# Patient Record
Sex: Male | Born: 1950
Health system: Southern US, Community
[De-identification: ages and names within clinical notes are randomized; demographics above are authoritative.]

## PROBLEM LIST (undated history)

## (undated) DIAGNOSIS — M1990 Unspecified osteoarthritis, unspecified site: Secondary | ICD-10-CM

## (undated) DIAGNOSIS — G4730 Sleep apnea, unspecified: Secondary | ICD-10-CM

## (undated) DIAGNOSIS — T7840XA Allergy, unspecified, initial encounter: Secondary | ICD-10-CM

## (undated) DIAGNOSIS — J45909 Unspecified asthma, uncomplicated: Secondary | ICD-10-CM

## (undated) HISTORY — PX: POLYPECTOMY: SHX149

## (undated) HISTORY — PX: TONSILLECTOMY AND ADENOIDECTOMY: SUR1326

## (undated) HISTORY — DX: Allergy, unspecified, initial encounter: T78.40XA

## (undated) HISTORY — DX: Unspecified osteoarthritis, unspecified site: M19.90

## (undated) HISTORY — PX: ELBOW SURGERY: SHX618

---
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
## 2013-03-25 HISTORY — PX: COLONOSCOPY: SHX174

## 2013-10-30 DIAGNOSIS — J45909 Unspecified asthma, uncomplicated: Secondary | ICD-10-CM

## 2013-10-30 HISTORY — DX: Unspecified asthma, uncomplicated: J45.909

## 2016-11-10 DIAGNOSIS — E782 Mixed hyperlipidemia: Secondary | ICD-10-CM | POA: Diagnosis not present

## 2016-11-10 DIAGNOSIS — R7301 Impaired fasting glucose: Secondary | ICD-10-CM | POA: Diagnosis not present

## 2016-11-10 DIAGNOSIS — Z131 Encounter for screening for diabetes mellitus: Secondary | ICD-10-CM | POA: Diagnosis not present

## 2016-11-10 DIAGNOSIS — Z125 Encounter for screening for malignant neoplasm of prostate: Secondary | ICD-10-CM | POA: Diagnosis not present

## 2016-11-10 DIAGNOSIS — Z0000 Encounter for general adult medical examination without abnormal findings: Secondary | ICD-10-CM | POA: Diagnosis not present

## 2016-11-10 DIAGNOSIS — Z1389 Encounter for screening for other disorder: Secondary | ICD-10-CM | POA: Diagnosis not present

## 2016-11-10 DIAGNOSIS — Z6833 Body mass index (BMI) 33.0-33.9, adult: Secondary | ICD-10-CM | POA: Diagnosis not present

## 2016-11-10 DIAGNOSIS — Z1211 Encounter for screening for malignant neoplasm of colon: Secondary | ICD-10-CM | POA: Diagnosis not present

## 2016-11-10 DIAGNOSIS — Z13220 Encounter for screening for lipoid disorders: Secondary | ICD-10-CM | POA: Diagnosis not present

## 2016-11-15 DIAGNOSIS — Z0184 Encounter for antibody response examination: Secondary | ICD-10-CM | POA: Diagnosis not present

## 2016-11-22 DIAGNOSIS — M7989 Other specified soft tissue disorders: Secondary | ICD-10-CM | POA: Diagnosis not present

## 2016-11-22 DIAGNOSIS — Z6832 Body mass index (BMI) 32.0-32.9, adult: Secondary | ICD-10-CM | POA: Diagnosis not present

## 2016-11-22 DIAGNOSIS — Z8619 Personal history of other infectious and parasitic diseases: Secondary | ICD-10-CM | POA: Diagnosis not present

## 2016-11-22 DIAGNOSIS — B86 Scabies: Secondary | ICD-10-CM | POA: Diagnosis not present

## 2016-11-22 DIAGNOSIS — M25562 Pain in left knee: Secondary | ICD-10-CM | POA: Diagnosis not present

## 2016-12-06 DIAGNOSIS — Z6833 Body mass index (BMI) 33.0-33.9, adult: Secondary | ICD-10-CM | POA: Diagnosis not present

## 2016-12-06 DIAGNOSIS — S8992XD Unspecified injury of left lower leg, subsequent encounter: Secondary | ICD-10-CM | POA: Diagnosis not present

## 2017-04-05 DIAGNOSIS — D1720 Benign lipomatous neoplasm of skin and subcutaneous tissue of unspecified limb: Secondary | ICD-10-CM | POA: Diagnosis not present

## 2017-04-05 DIAGNOSIS — Z6832 Body mass index (BMI) 32.0-32.9, adult: Secondary | ICD-10-CM | POA: Diagnosis not present

## 2017-04-05 DIAGNOSIS — Z23 Encounter for immunization: Secondary | ICD-10-CM | POA: Diagnosis not present

## 2017-04-05 DIAGNOSIS — Z7189 Other specified counseling: Secondary | ICD-10-CM | POA: Diagnosis not present

## 2017-06-20 DIAGNOSIS — J329 Chronic sinusitis, unspecified: Secondary | ICD-10-CM | POA: Diagnosis not present

## 2017-06-20 DIAGNOSIS — Z6832 Body mass index (BMI) 32.0-32.9, adult: Secondary | ICD-10-CM | POA: Diagnosis not present

## 2017-08-21 ENCOUNTER — Ambulatory Visit (INDEPENDENT_AMBULATORY_CARE_PROVIDER_SITE_OTHER): Payer: Medicare PPO

## 2017-08-21 ENCOUNTER — Encounter: Payer: Self-pay | Admitting: Podiatry

## 2017-08-21 ENCOUNTER — Ambulatory Visit: Payer: Medicare PPO | Admitting: Podiatry

## 2017-08-21 VITALS — BP 118/72 | HR 68 | Ht 68.0 in | Wt 214.0 lb

## 2017-08-21 DIAGNOSIS — M2012 Hallux valgus (acquired), left foot: Secondary | ICD-10-CM

## 2017-08-21 DIAGNOSIS — M79672 Pain in left foot: Secondary | ICD-10-CM

## 2017-08-21 DIAGNOSIS — M7752 Other enthesopathy of left foot: Secondary | ICD-10-CM

## 2017-08-21 DIAGNOSIS — D3610 Benign neoplasm of peripheral nerves and autonomic nervous system, unspecified: Secondary | ICD-10-CM

## 2017-08-21 DIAGNOSIS — M779 Enthesopathy, unspecified: Secondary | ICD-10-CM

## 2017-08-21 MED ORDER — MELOXICAM 15 MG PO TABS: 15.0000 mg | ORAL_TABLET | Freq: Every day | ORAL | 0 refills | Status: DC

## 2017-08-21 NOTE — Progress Notes (Signed)
  Subjective:  Patient ID: Jesse Johns, male    DOB: 1951-04-20,  MRN: [id number]  Chief Complaint  Patient presents with  . Bunions    bunion left 1st moving in the wrong direction   . Neuroma    possilbe neuroma between 2nd & 3rd toes feels like a blister at times but nothing is there pain comes and goes     67 y.o. male presents with the above complaint.  Reports a bunion to the left great toe.  Denies pain from the bunion.  Also complains of the pain between the second and third toes while walking.  No pain at rest.  States that he feels that there is a blister there sometimes.  No past medical history on file.   Current Outpatient Medications:  .  meloxicam (MOBIC) 15 MG tablet, Take 1 tablet (15 mg total) by mouth daily., Disp: 30 tablet, Rfl: 0  No Known Allergies Review of Systems Objective:   Vitals:   08/21/17 0912  BP: 118/72  Pulse: 68   General AA&O x3. Normal mood and affect.  Vascular Dors alis pedis and posterior tibial pulses  present 2+ bilaterally  Capillary refill normal to all digits. Pedal hair growth normal.  Neurologic Epicritic sensation grossly present.  Dermatologic No open lesions. Interspaces clear of maceration. Nails well groomed and normal in appearance.  Orthopedic: MMT 5/5 in dorsiflexion, plantarflexion, inversion, and eversion. Normal joint ROM without pain or crepitus. Local warmth and pain to palpation of the left second MPJ.  Pain to palpation third MPJ left HAV deformity bilateral without pain to palpation   Assessment & Plan:  Patient was evaluated and treated and all questions answered.  L 2nd MPJ Capsulitis -XR taken and reviewed.  Degenerative changes of the second proximal phalanx base.  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 Injectate: 0.5 cc 1% lidocaine plain, 0.5 cc dexamethasone  phosphate. Disposition: Patient tolerated procedure well. Injection site dressed with a band-aid.  HAV L -Asymptomatic. Discussed that should he require surgical correction of the second MPJ he would likely need bunion correction to allow the second toe to lay straight -Tube foam bunion shield dispensed  Return in about 3 weeks (around 09/11/2017) for Capsulitis.

## 2017-09-11 ENCOUNTER — Ambulatory Visit (INDEPENDENT_AMBULATORY_CARE_PROVIDER_SITE_OTHER): Payer: Medicare PPO | Admitting: Podiatry

## 2017-09-11 DIAGNOSIS — M779 Enthesopathy, unspecified: Secondary | ICD-10-CM

## 2017-09-11 DIAGNOSIS — M659 Synovitis and tenosynovitis, unspecified: Secondary | ICD-10-CM

## 2017-09-11 NOTE — Progress Notes (Signed)
  Subjective:  Patient ID: Jesse Johns, male    DOB: [birth year],  MRN: [id number]  Chief Complaint  Patient presents with  . capsulitis    F/U Lt capsulitis and Hav. Pt stated," first two weeks it was doing fine, but sensitivity started coming back again; tenderness." Tx: meloxicam (helps)   67 y.o. male returns for the above complaint.  States that he was doing well for the first 2 weeks after the injection but tenderness came back shortly after that.  Objective:  There were no vitals filed for this visit. General AA&O x3. Normal mood and affect.  Vascular Pedal pulses palpable.  Neurologic Epicritic sensation grossly intact.  Dermatologic No open lesions. Skin normal texture and turgor.  Orthopedic:  States that the first injection he was doing fine pain to palpation about the left second MPJ   Assessment & Plan:  Patient was evaluated and treated and all questions answered.  Second MPJ capsulitis left -Repeat injection as below  -Should pain recur would consider possible orthotic therapy versus surgical decompression of the joint  No Follow-up on file.

## 2017-10-02 ENCOUNTER — Ambulatory Visit: Payer: Medicare PPO | Admitting: Podiatry

## 2017-10-09 ENCOUNTER — Ambulatory Visit: Payer: Medicare PPO | Admitting: Podiatry

## 2017-10-09 DIAGNOSIS — M6740 Ganglion, unspecified site: Secondary | ICD-10-CM | POA: Diagnosis not present

## 2017-10-09 DIAGNOSIS — M779 Enthesopathy, unspecified: Secondary | ICD-10-CM

## 2017-10-09 MED ORDER — MELOXICAM 15 MG PO TABS
15.0000 mg | ORAL_TABLET | Freq: Every day | ORAL | 0 refills | Status: DC
Start: 2017-10-09 — End: 2019-04-08

## 2017-10-09 NOTE — Progress Notes (Signed)
  Subjective:  Patient ID: Jesse Johns, male    DOB: 28-May-1951,  MRN: [id number]  Chief Complaint  Patient presents with  . capsulitis    F/U Lt capsulitis Pt. stated," it seems like it's doing a little bit better, but still having some discomfort; 5/10." Tx: Meloxicam (not sure if it helps or not)   67 y.o. male returns for the above complaint.  States that he was doing well for the first 2 weeks after the injection but tenderness came back shortly after that.  Objective:  There were no vitals filed for this visit. General AA&O x3. Normal mood and affect.  Vascular Pedal pulses palpable.  Neurologic Epicritic sensation grossly intact.  Dermatologic No open lesions. Skin normal texture and turgor.  Orthopedic: Pain to palpation about the left second MPJ Ganglion cyst dorsal 2nd MPJ with pain to palpation.   Assessment & Plan:  Patient was evaluated and treated and all questions answered.  Second MPJ capsulitis left -Should pain persist will consider surgical decompression of the joint -Reinjection as below.  Procedure: Joint Injection Location: Left 2nd MPJ joint Skin Prep: Alcohol. Injectate: 0.5 cc 1% lidocaine plain, 0.5 cc dexamethasone phosphate. Disposition: Patient tolerated procedure well. Injection site dressed with a band-aid.  Ganglion Cyst Dorsal 2nd MPJ -Would consider removal in the future should it become symptomatic.  Return in about 3 weeks (around 10/30/2017) for Capsulitis, Jun[place].

## 2017-10-31 ENCOUNTER — Ambulatory Visit: Payer: Medicare PPO | Admitting: Podiatry

## 2017-10-31 ENCOUNTER — Encounter: Payer: Self-pay | Admitting: Podiatry

## 2017-10-31 DIAGNOSIS — M779 Enthesopathy, unspecified: Secondary | ICD-10-CM

## 2017-10-31 NOTE — Progress Notes (Signed)
  Subjective:  Patient ID: Jesse Johns, male    DOB: Nov 20, 1950,  MRN: [id number]  No chief complaint on file.  67 y.o. male returns for the above complaint.  States he thinks the injection did help him.  Still having some pain but does not like what it was before.  Using meloxicam and states that it helps.  Objective:  There were no vitals filed for this visit. General AA&O x3. Normal mood and affect.  Vascular Pedal pulses palpable.  Neurologic Epicritic sensation grossly intact.  Dermatologic No open lesions. Skin normal texture and turgor.  Orthopedic: Pain on palpation L 2nd MPJ Rigid hammertoe right second toe Palpable ganglion cyst second third MPJ HAV deformity left   Assessment & Plan:  Patient was evaluated and treated and all questions answered.  Capsulitis, Ganglion Cyst 2nd MPJ -Re-injection as below -Again discussed surgical options should pain persist. Would potentially require Austin bunionectomy, 2nd HT correction, 2nd MPJ replacement vs weil osteotomy.  Procedure: Joint Injection Location: Left 2nd MPJ joint Skin Prep: Alcohol. Injectate: 0.5 cc 1% lidocaine plain, 0.5 cc dexamethasone phosphate. Disposition: Patient tolerated procedure well. Injection site dressed with a band-aid.   Return in about 6 weeks (around 12/12/2017) for Tendonitis, Capsulitis.

## 2017-12-12 ENCOUNTER — Ambulatory Visit: Payer: Medicare PPO | Admitting: Podiatry

## 2017-12-12 DIAGNOSIS — M779 Enthesopathy, unspecified: Secondary | ICD-10-CM

## 2017-12-12 DIAGNOSIS — M7751 Other enthesopathy of right foot: Secondary | ICD-10-CM

## 2017-12-31 NOTE — Progress Notes (Signed)
  Subjective:  Patient ID: Jesse Johns, male    DOB: 04/23/1951,  MRN: [id number]  Chief Complaint  Patient presents with  . capsulitis    F/U capsulitis PT. stated," it's improving some, but it's still there; 3/10 dull pain." Tx: meloxicam (PRN)    67 y.o. male returns for the above complaint.  States pain is improving some but is still there is a 3 out of 10 dull pain Objective:  There were no vitals filed for this visit. General AA&O x3. Normal mood and affect.  Vascular Pedal pulses palpable.  Neurologic Epicritic sensation grossly intact.  Dermatologic No open lesions. Skin normal texture and turgor.  Orthopedic: Pain on palpation L 2nd MPJ Rigid hammertoe right second toe Palpable ganglion cyst second third MPJ HAV deformity left   Assessment & Plan:  Patient was evaluated and treated and all questions answered.  Capsulitis, Ganglion Cyst 2nd MPJ -Re-injection as below -Again discussed surgical options should pain persist. Would potentially require Austin bunionectomy, 2nd HT correction, 2nd MPJ replacement vs weil osteotomy.  Procedure: Joint Injection Location: Left 2nd MPJ joint Skin Prep: Alcohol. Injectate: 0.5 cc 1% lidocaine plain, 0.5 cc dexamethasone phosphate. Disposition: Patient tolerated procedure well. Injection site dressed with a band-aid.  F/u in 6 weeks.  No follow-ups on file.

## 2018-01-23 ENCOUNTER — Ambulatory Visit: Payer: Medicare PPO | Admitting: Podiatry

## 2018-01-23 DIAGNOSIS — M779 Enthesopathy, unspecified: Secondary | ICD-10-CM | POA: Diagnosis not present

## 2018-01-23 DIAGNOSIS — M659 Synovitis and tenosynovitis, unspecified: Secondary | ICD-10-CM | POA: Diagnosis not present

## 2018-01-23 DIAGNOSIS — M6740 Ganglion, unspecified site: Secondary | ICD-10-CM | POA: Diagnosis not present

## 2018-01-23 NOTE — Progress Notes (Signed)
  Subjective:  Patient ID: Jesse Johns, male    DOB: [birth year],  MRN: [id number]  Chief Complaint  Patient presents with  . capsulitis    F/U capsulitis PT. stated," been pretty good lately, it's better 67% better." tx: meloxicam (PRN)  . Ganglion Cyst    F/U 2nd MPJ Pt. stated," not sure if it's bigger or not."   67 y.o. male returns for the above complaint.  Cyst is about the same. Foot pain is 67% better. Objective:  There were no vitals filed for this visit. General AA&O x3. Normal mood and affect.  Vascular Pedal pulses palpable.  Neurologic Epicritic sensation grossly intact.  Dermatologic No open lesions. Skin normal texture and turgor.  Orthopedic: Pain on palpation L 2nd MPJ Rigid hammertoe right second toe Palpable ganglion cyst second third MPJ HAV deformity left   Assessment & Plan:  Patient was evaluated and treated and all questions answered.  Capsulitis, Ganglion Cyst 2nd MPJ -Improved. No injection today. Should pain persist would discuss surgical intervention. Would potentially require Austin bunionectomy, 2nd HT correction, 2nd MPJ replacement vs weil osteotomy.  F/u PRN for possible surgery should pain persist.

## 2018-02-01 ENCOUNTER — Encounter: Payer: Self-pay | Admitting: Gastroenterology

## 2018-02-15 DIAGNOSIS — Z1211 Encounter for screening for malignant neoplasm of colon: Secondary | ICD-10-CM | POA: Diagnosis not present

## 2018-02-15 DIAGNOSIS — Z1331 Encounter for screening for depression: Secondary | ICD-10-CM | POA: Diagnosis not present

## 2018-02-15 DIAGNOSIS — Z139 Encounter for screening, unspecified: Secondary | ICD-10-CM | POA: Diagnosis not present

## 2018-02-15 DIAGNOSIS — Z125 Encounter for screening for malignant neoplasm of prostate: Secondary | ICD-10-CM | POA: Diagnosis not present

## 2018-02-15 DIAGNOSIS — E782 Mixed hyperlipidemia: Secondary | ICD-10-CM | POA: Diagnosis not present

## 2018-02-15 DIAGNOSIS — N529 Male erectile dysfunction, unspecified: Secondary | ICD-10-CM | POA: Diagnosis not present

## 2018-02-15 DIAGNOSIS — R7301 Impaired fasting glucose: Secondary | ICD-10-CM | POA: Diagnosis not present

## 2018-02-15 DIAGNOSIS — Z0000 Encounter for general adult medical examination without abnormal findings: Secondary | ICD-10-CM | POA: Diagnosis not present

## 2018-02-23 DIAGNOSIS — E782 Mixed hyperlipidemia: Secondary | ICD-10-CM | POA: Diagnosis not present

## 2018-02-23 DIAGNOSIS — Z1331 Encounter for screening for depression: Secondary | ICD-10-CM | POA: Diagnosis not present

## 2018-02-23 DIAGNOSIS — M25561 Pain in right knee: Secondary | ICD-10-CM | POA: Diagnosis not present

## 2018-02-23 DIAGNOSIS — R7303 Prediabetes: Secondary | ICD-10-CM | POA: Diagnosis not present

## 2018-02-23 DIAGNOSIS — Z23 Encounter for immunization: Secondary | ICD-10-CM | POA: Diagnosis not present

## 2018-02-23 DIAGNOSIS — N529 Male erectile dysfunction, unspecified: Secondary | ICD-10-CM | POA: Diagnosis not present

## 2018-03-15 DIAGNOSIS — L821 Other seborrheic keratosis: Secondary | ICD-10-CM | POA: Diagnosis not present

## 2018-03-15 DIAGNOSIS — L578 Other skin changes due to chronic exposure to nonionizing radiation: Secondary | ICD-10-CM | POA: Diagnosis not present

## 2018-03-15 DIAGNOSIS — L820 Inflamed seborrheic keratosis: Secondary | ICD-10-CM | POA: Diagnosis not present

## 2018-03-15 DIAGNOSIS — L570 Actinic keratosis: Secondary | ICD-10-CM | POA: Diagnosis not present

## 2018-03-23 DIAGNOSIS — Z139 Encounter for screening, unspecified: Secondary | ICD-10-CM | POA: Diagnosis not present

## 2018-03-23 DIAGNOSIS — M25561 Pain in right knee: Secondary | ICD-10-CM | POA: Diagnosis not present

## 2018-03-23 DIAGNOSIS — Z6834 Body mass index (BMI) 34.0-34.9, adult: Secondary | ICD-10-CM | POA: Diagnosis not present

## 2018-04-02 DIAGNOSIS — Z6834 Body mass index (BMI) 34.0-34.9, adult: Secondary | ICD-10-CM | POA: Diagnosis not present

## 2018-04-02 DIAGNOSIS — E669 Obesity, unspecified: Secondary | ICD-10-CM | POA: Diagnosis not present

## 2018-04-02 DIAGNOSIS — H00019 Hordeolum externum unspecified eye, unspecified eyelid: Secondary | ICD-10-CM | POA: Diagnosis not present

## 2018-04-02 DIAGNOSIS — Z139 Encounter for screening, unspecified: Secondary | ICD-10-CM | POA: Diagnosis not present

## 2018-04-06 DIAGNOSIS — H00019 Hordeolum externum unspecified eye, unspecified eyelid: Secondary | ICD-10-CM | POA: Diagnosis not present

## 2018-04-06 DIAGNOSIS — M25561 Pain in right knee: Secondary | ICD-10-CM | POA: Diagnosis not present

## 2018-05-03 DIAGNOSIS — M25561 Pain in right knee: Secondary | ICD-10-CM | POA: Diagnosis not present

## 2018-05-03 DIAGNOSIS — M2351 Chronic instability of knee, right knee: Secondary | ICD-10-CM | POA: Diagnosis not present

## 2018-05-03 DIAGNOSIS — G8929 Other chronic pain: Secondary | ICD-10-CM | POA: Diagnosis not present

## 2018-05-09 ENCOUNTER — Other Ambulatory Visit: Payer: Self-pay | Admitting: Orthopedic Surgery

## 2018-05-09 DIAGNOSIS — M25561 Pain in right knee: Secondary | ICD-10-CM

## 2018-05-11 ENCOUNTER — Ambulatory Visit
Admission: RE | Admit: 2018-05-11 | Discharge: 2018-05-11 | Disposition: A | Discharge status: Home / Self Care | Payer: Medicare HMO | Source: Ambulatory Visit | Attending: Orthopedic Surgery | Admitting: Orthopedic Surgery

## 2018-05-11 DIAGNOSIS — M25561 Pain in right knee: Secondary | ICD-10-CM

## 2018-05-11 DIAGNOSIS — M23321 Other meniscus derangements, posterior horn of medial meniscus, right knee: Secondary | ICD-10-CM | POA: Diagnosis not present

## 2018-06-13 DIAGNOSIS — M6751 Plica syndrome, right knee: Secondary | ICD-10-CM | POA: Diagnosis not present

## 2018-06-13 DIAGNOSIS — M659 Synovitis and tenosynovitis, unspecified: Secondary | ICD-10-CM | POA: Diagnosis not present

## 2018-06-13 DIAGNOSIS — M94261 Chondromalacia, right knee: Secondary | ICD-10-CM | POA: Diagnosis not present

## 2018-06-13 DIAGNOSIS — S83231A Complex tear of medial meniscus, current injury, right knee, initial encounter: Secondary | ICD-10-CM | POA: Diagnosis not present

## 2018-06-13 DIAGNOSIS — G8918 Other acute postprocedural pain: Secondary | ICD-10-CM | POA: Diagnosis not present

## 2018-06-13 HISTORY — PX: KNEE ARTHROSCOPY W/ MENISCAL REPAIR: SHX1877

## 2018-06-19 DIAGNOSIS — Z9889 Other specified postprocedural states: Secondary | ICD-10-CM | POA: Insufficient documentation

## 2018-06-19 DIAGNOSIS — S83231A Complex tear of medial meniscus, current injury, right knee, initial encounter: Secondary | ICD-10-CM | POA: Diagnosis not present

## 2018-06-19 DIAGNOSIS — M25661 Stiffness of right knee, not elsewhere classified: Secondary | ICD-10-CM | POA: Diagnosis not present

## 2018-11-05 DIAGNOSIS — L821 Other seborrheic keratosis: Secondary | ICD-10-CM | POA: Diagnosis not present

## 2018-11-05 DIAGNOSIS — L820 Inflamed seborrheic keratosis: Secondary | ICD-10-CM | POA: Diagnosis not present

## 2018-11-05 DIAGNOSIS — L570 Actinic keratosis: Secondary | ICD-10-CM | POA: Diagnosis not present

## 2018-11-05 DIAGNOSIS — L578 Other skin changes due to chronic exposure to nonionizing radiation: Secondary | ICD-10-CM | POA: Diagnosis not present

## 2019-01-15 DIAGNOSIS — Z6834 Body mass index (BMI) 34.0-34.9, adult: Secondary | ICD-10-CM | POA: Diagnosis not present

## 2019-01-15 DIAGNOSIS — Z0000 Encounter for general adult medical examination without abnormal findings: Secondary | ICD-10-CM | POA: Diagnosis not present

## 2019-01-15 DIAGNOSIS — Z1331 Encounter for screening for depression: Secondary | ICD-10-CM | POA: Diagnosis not present

## 2019-01-15 DIAGNOSIS — Z139 Encounter for screening, unspecified: Secondary | ICD-10-CM | POA: Diagnosis not present

## 2019-01-15 DIAGNOSIS — R21 Rash and other nonspecific skin eruption: Secondary | ICD-10-CM | POA: Diagnosis not present

## 2019-03-07 ENCOUNTER — Encounter: Payer: Self-pay | Admitting: Gastroenterology

## 2019-03-18 DIAGNOSIS — E669 Obesity, unspecified: Secondary | ICD-10-CM | POA: Diagnosis not present

## 2019-03-18 DIAGNOSIS — L570 Actinic keratosis: Secondary | ICD-10-CM | POA: Diagnosis not present

## 2019-03-18 DIAGNOSIS — Z6833 Body mass index (BMI) 33.0-33.9, adult: Secondary | ICD-10-CM | POA: Diagnosis not present

## 2019-04-08 ENCOUNTER — Other Ambulatory Visit: Payer: Self-pay

## 2019-04-08 ENCOUNTER — Ambulatory Visit (AMBULATORY_SURGERY_CENTER): Payer: Self-pay | Admitting: *Deleted

## 2019-04-08 VITALS — Temp 96.9°F | Ht 68.0 in | Wt 224.6 lb

## 2019-04-08 DIAGNOSIS — Z86010 Personal history of colonic polyps: Secondary | ICD-10-CM

## 2019-04-08 DIAGNOSIS — K589 Irritable bowel syndrome without diarrhea: Secondary | ICD-10-CM | POA: Insufficient documentation

## 2019-04-08 DIAGNOSIS — J301 Allergic rhinitis due to pollen: Secondary | ICD-10-CM | POA: Insufficient documentation

## 2019-04-08 NOTE — Progress Notes (Signed)
Patient is here in-person for PV. Patient denies any allergies to eggs or soy. Patient denies any problems with anesthesia/sedation. Patient denies any oxygen use at home. Patient denies taking any diet/weight loss medications or blood thinners. Patient is not being treated for MRSA or C-diff. EMMI education assisgned to patient on colonoscopy, this was explained and instructions given to patient.   Pt is aware that care partner will wait in the car during procedure; if they feel like they will be too hot to wait in the car; they may wait in the lobby. Patient is aware to bring only one care partner. We want them to wear a mask (we do not have any that we can provide them), practice social distancing, and we will check their temperatures when they get here.  I did remind patient that their care partner needs to stay in the parking lot the entire time. Pt will wear mask into building.

## 2019-04-10 ENCOUNTER — Encounter: Payer: Self-pay | Admitting: Gastroenterology

## 2019-04-19 ENCOUNTER — Telehealth: Payer: Self-pay | Admitting: Gastroenterology

## 2019-04-22 ENCOUNTER — Ambulatory Visit (AMBULATORY_SURGERY_CENTER): Payer: Medicare PPO | Admitting: Gastroenterology

## 2019-04-22 ENCOUNTER — Encounter: Payer: Self-pay | Admitting: Gastroenterology

## 2019-04-22 ENCOUNTER — Other Ambulatory Visit: Payer: Self-pay

## 2019-04-22 VITALS — BP 135/70 | HR 60 | Temp 98.0°F | Resp 16 | Ht 68.0 in | Wt 224.0 lb

## 2019-04-22 DIAGNOSIS — Z86010 Personal history of colonic polyps: Secondary | ICD-10-CM

## 2019-04-22 MED ORDER — SODIUM CHLORIDE 0.9 % IV SOLN: 500.0000 mL | Freq: Once | INTRAVENOUS | Status: DC

## 2019-04-22 NOTE — Op Note (Signed)
[facility] Patient Name: Jesse Johns Procedure Date: 04/22/2019 9:03 AM MRN: [id number] Endoscopist: Jackquline Denmark , MD Age: 68 Referring MD:  Date of Birth: [birth year] Gender: Male Account #: [id number] Procedure:                Colonoscopy Indications:              High risk colon cancer surveillance: Personal                            history of colonic polyps Medicines:                Monitored Anesthesia Care Procedure:                Pre-Anesthesia Assessment:                           - Prior to the procedure, a History and Physical                            was performed, and patient medications and                            allergies were reviewed. The patient's tolerance of                            previous anesthesia was also reviewed. The risks                            and benefits of the procedure and the sedation                            options and risks were discussed with the patient.                            All questions were answered, and informed consent                            was obtained. Prior Anticoagulants: The patient has                            taken no previous anticoagulant or antiplatelet                            agents. ASA Grade Assessment: II - A patient with                            mild systemic disease. After reviewing the risks                            and benefits, the patient was deemed in                            satisfactory condition to undergo the procedure.  After obtaining informed consent, the colonoscope                            was passed under direct vision. Throughout the                            procedure, the patient's blood pressure, pulse, and                            oxygen saturations were monitored continuously. The                            Colonoscope was introduced through the anus and                            advanced to the the cecum, identified by                             appendiceal orifice and ileocecal valve. The                            colonoscopy was performed without difficulty. The                            patient tolerated the procedure well. The quality                            of the bowel preparation was good. The ileocecal                            valve, appendiceal orifice, and rectum were                            photographed. Scope In: 9:11:22 AM Scope Out: 9:18:50 AM Scope Withdrawal Time: 0 hours 5 minutes 32 seconds  Total Procedure Duration: 0 hours 7 minutes 28 seconds  Findings:                 Non-bleeding internal hemorrhoids were found during                            retroflexion. The hemorrhoids were small. Evidence                            of previous hemorrhoidectomy.                           The exam was otherwise without abnormality on                            direct and retroflexion views. Complications:            No immediate complications. Estimated Blood Loss:     Estimated blood loss: none. Impression:               - Non-bleeding internal hemorrhoids.                           -  The examination was otherwise normal on direct                            and retroflexion views.                           - No specimens collected. Recommendation:           - Patient has a contact number available for                            emergencies. The signs and symptoms of potential                            delayed complications were discussed with the                            patient. Return to normal activities tomorrow.                            Written discharge instructions were provided to the                            patient.                           - Resume previous diet.                           - Continue present medications.                           - Repeat colonoscopy in 10 years for screening                            purposes. Earlier, if with any new problems or if                             there is any change in family history.                           - Return to GI clinic PRN. Jackquline Denmark, MD 04/22/2019 9:23:08 AM This report has been signed electronically.

## 2019-04-22 NOTE — Progress Notes (Signed)
Report to PACU, RN, vss, BBS= Clear.  

## 2019-04-22 NOTE — Patient Instructions (Signed)
  Handouts given : Hemoprrhoids  YOU HAD AN ENDOSCOPIC PROCEDURE TODAY AT [facility]:   Refer to the procedure report that was given to you for any specific questions about what was found during the examination.  If the procedure report does not answer your questions, please call your gastroenterologist to clarify.  If you requested that your care partner not be given the details of your procedure findings, then the procedure report has been included in a sealed envelope for you to review at your convenience later.  YOU SHOULD EXPECT: Some feelings of bloating in the abdomen. Passage of more gas than usual.  Walking can help get rid of the air that was put into your GI tract during the procedure and reduce the bloating. If you had a lower endoscopy (such as a colonoscopy or flexible sigmoidoscopy) you may notice spotting of blood in your stool or on the toilet paper. If you underwent a bowel prep for your procedure, you may not have a normal bowel movement for a few days.  Please Note:  You might notice some irritation and congestion in your nose or some drainage.  This is from the oxygen used during your procedure.  There is no need for concern and it should clear up in a Schlafer or so.  SYMPTOMS TO REPORT IMMEDIATELY:   Following lower endoscopy (colonoscopy or flexible sigmoidoscopy):  Excessive amounts of blood in the stool  Significant tenderness or worsening of abdominal pains  Swelling of the abdomen that is new, acute  Fever of 100F or higher   For urgent or emergent issues, a gastroenterologist can be reached at any hour by calling [phone].   DIET:  We do recommend a small meal at first, but then you may proceed to your regular diet.  Drink plenty of fluids but you should avoid alcoholic beverages for 24 hours.  ACTIVITY:  You should plan to take it easy for the rest of today and you should NOT DRIVE or use heavy machinery until tomorrow (because of the sedation  medicines used during the test).    FOLLOW UP: Our staff will call the number listed on your records 48-72 hours following your procedure to check on you and address any questions or concerns that you may have regarding the information given to you following your procedure. If we do not reach you, we will leave a message.  We will attempt to reach you two times.  During this call, we will ask if you have developed any symptoms of COVID 19. If you develop any symptoms (ie: fever, flu-like symptoms, shortness of breath, cough etc.) before then, please call [phone].  If you test positive for Covid 19 in the 2 weeks post procedure, please call and report this information to [place].    If any biopsies were taken you will be contacted by phone or by letter within the next 1-3 weeks.  Please call us at [phone] if you have not heard about the biopsies in 3 weeks.    SIGNATURES/CONFIDENTIALITY: You and/or your care partner have signed paperwork which will be entered into your electronic medical record.  These signatures attest to the fact that that the information above on your After Visit Summary has been reviewed and is understood.  Full responsibility of the confidentiality of this discharge information lies with you and/or your care-partner.

## 2019-04-22 NOTE — Progress Notes (Signed)
Temp JB V/s CW I have reviewed the patient's medical history in detail and updated the computerized patient record. 

## 2019-04-24 ENCOUNTER — Telehealth: Payer: Self-pay | Admitting: *Deleted

## 2019-04-24 NOTE — Telephone Encounter (Signed)
  Follow up Call-  Call back number 04/22/2019  Post procedure Call Back phone  # [phone]  Permission to leave phone message Yes     Patient questions:  Do you have a fever, pain , or abdominal swelling? No. Pain Score  0 *  Have you tolerated food without any problems? Yes.    Have you been able to return to your normal activities? Yes.    Do you have any questions about your discharge instructions: Diet   No. Medications  No. Follow up visit  No.  Do you have questions or concerns about your Care? No.  Actions: * If pain score is 4 or above: No action needed, pain <4.  1. Have you developed a fever since your procedure? NO  2.   Have you had an respiratory symptoms (SOB or cough) since your procedure? NO  3.   Have you tested positive for COVID 19 since your procedure NO  4.   Have you had any family members/close contacts diagnosed with the COVID 19 since your procedure?  NO   If yes to any of these questions please route to Joylene John, RN and Alphonsa Gin, RN.

## 2019-04-24 NOTE — Telephone Encounter (Signed)
  Follow up Call-  Call back number 04/22/2019  Post procedure Call Back phone  # [phone]  Permission to leave phone message Yes    [facility]

## 2019-04-30 IMAGING — MR MR KNEE*R* W/O CM
4 of 6 series · 23 of 40 positions shown · non-contrast
Comparison: None.

CLINICAL DATA: Chronic posterior knee pain.  No injury.

EXAM:
MRI OF THE RIGHT KNEE WITHOUT CONTRAST
TECHNIQUE: Multiplanar, multisequence MR imaging of the knee was performed. No
intravenous contrast was administered.

[Series 4: T1 · coronal · 4.0mm · 0.29mm/px · 4 of 29 slices shown]
[im 1/29]
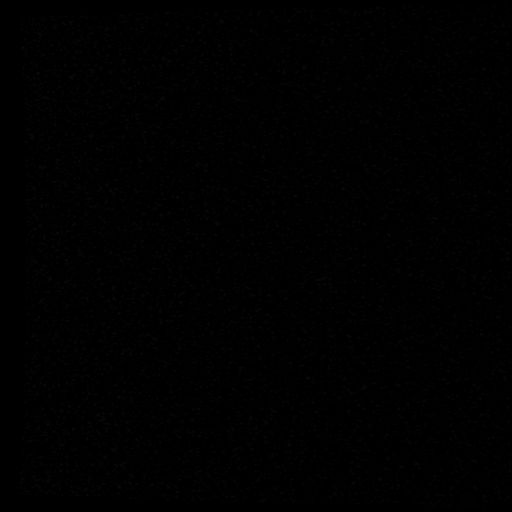
[im 5/29]
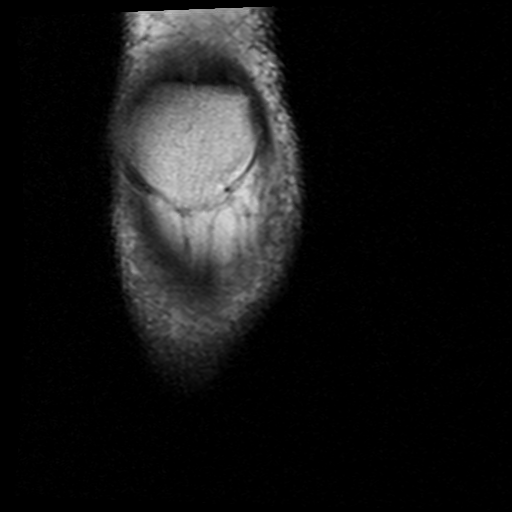
[im 15/29]
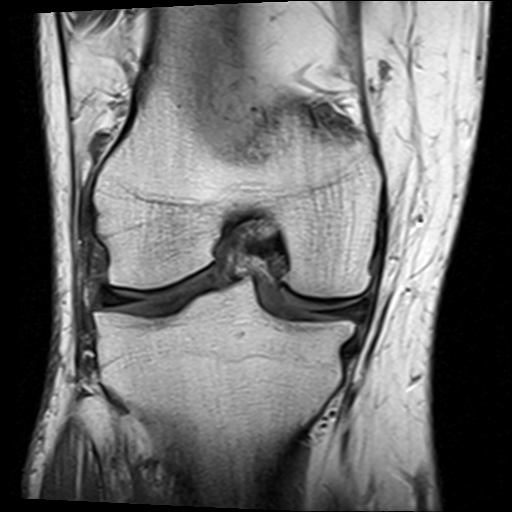
[im 24/29]
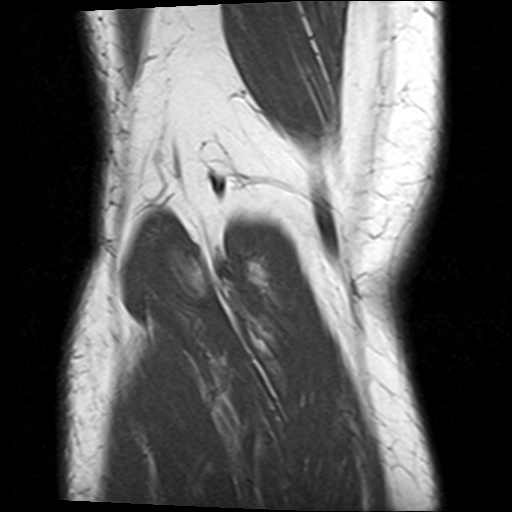

[Series 6: T2 fat-sat · coronal · 4.0mm · 0.59mm/px · 6 of 25 slices shown (1 of 2)]
[im 1/25]
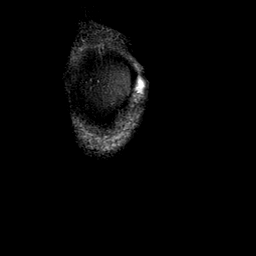
[im 5/25]
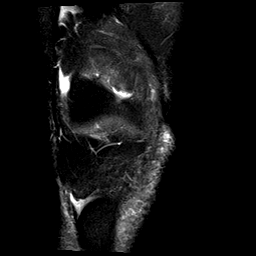
[im 10/25]
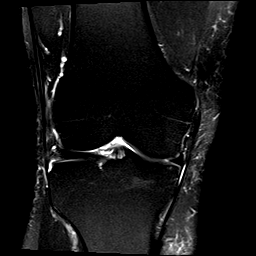
[im 15/25]
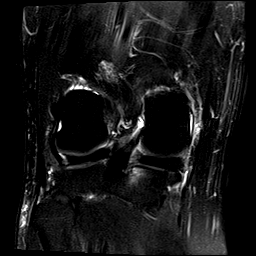
[im 20/25]
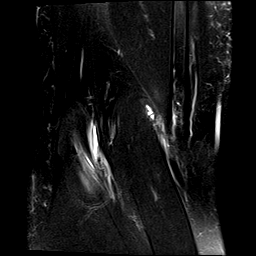
[im 25/25]
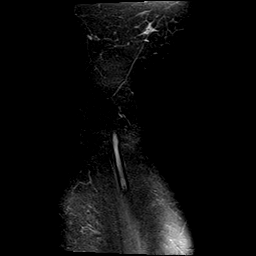

[Series 8: T2 fat-sat · sagittal · 3.0mm · 0.29mm/px · 6 of 26 slices shown (2 of 2)]
[im 1/26]
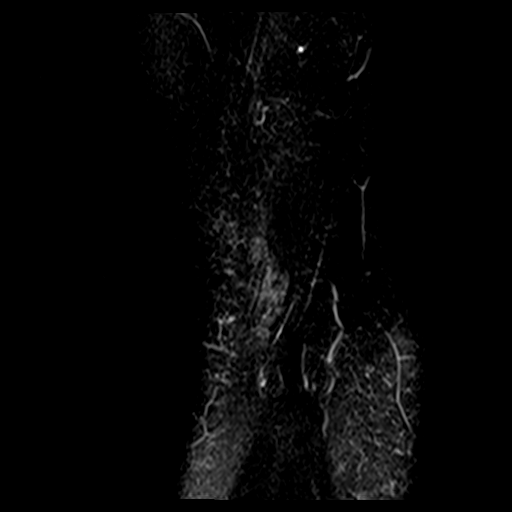
[im 6/26]
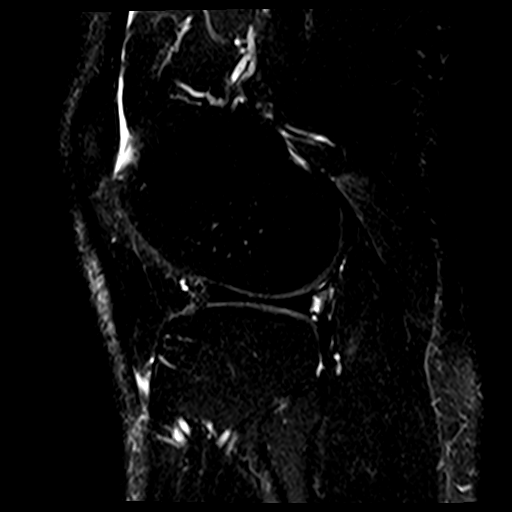
[im 11/26]
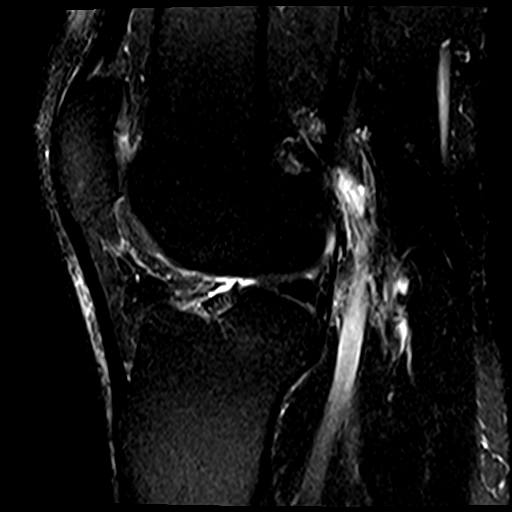
[im 16/26]
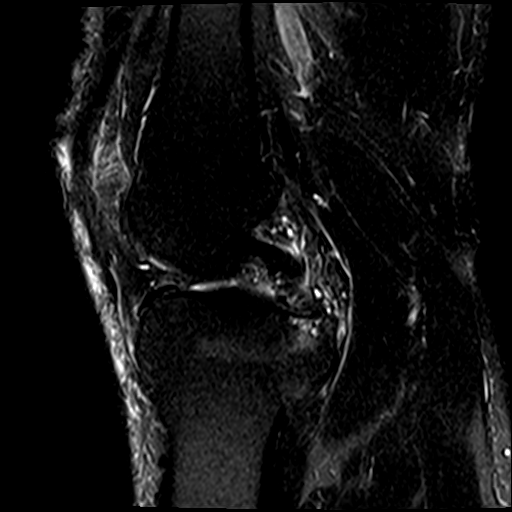
[im 21/26]
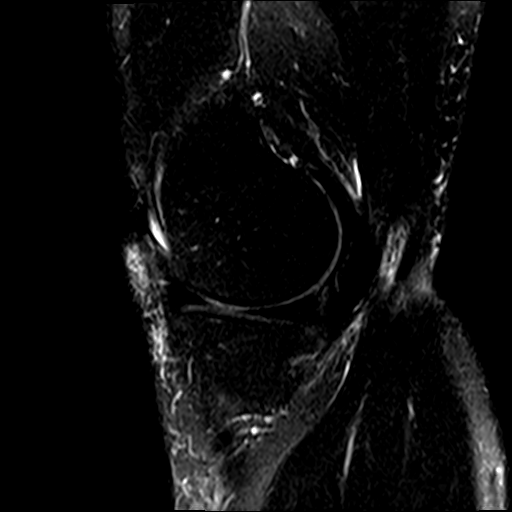
[im 26/26]
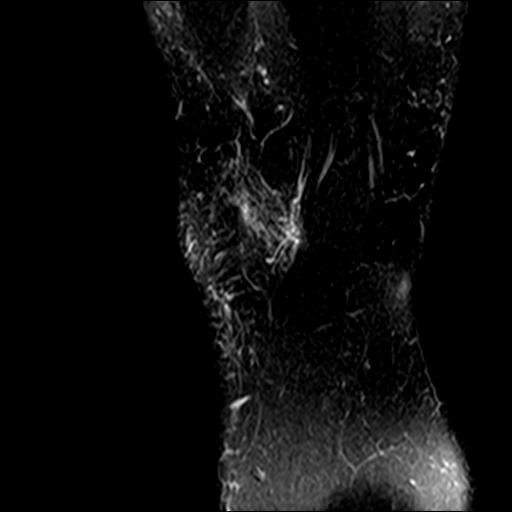

[Series 9: PD fat-sat · sagittal · 3.0mm · 0.29mm/px · 7 of 27 slices shown]
[im 1/27]
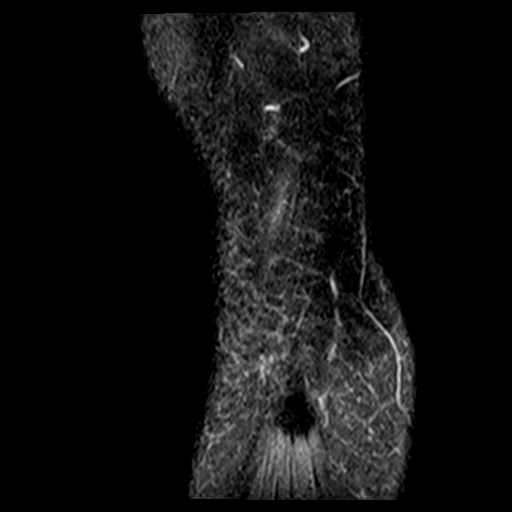
[im 5/27]
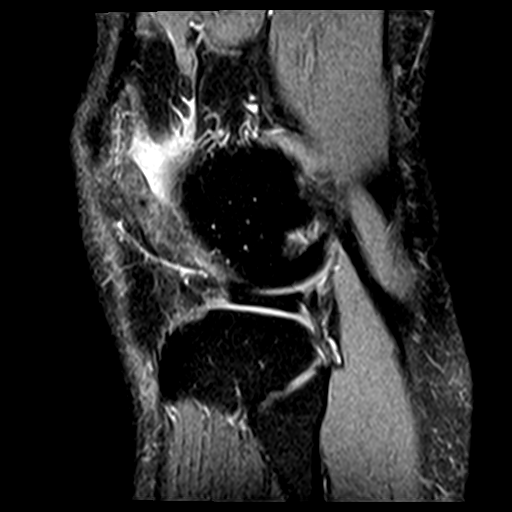
[im 9/27]
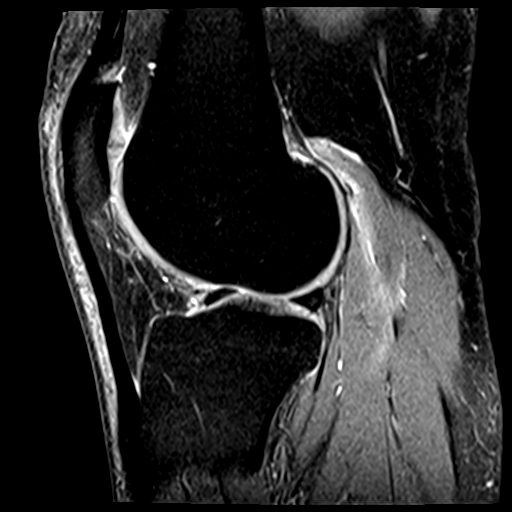
[im 14/27]
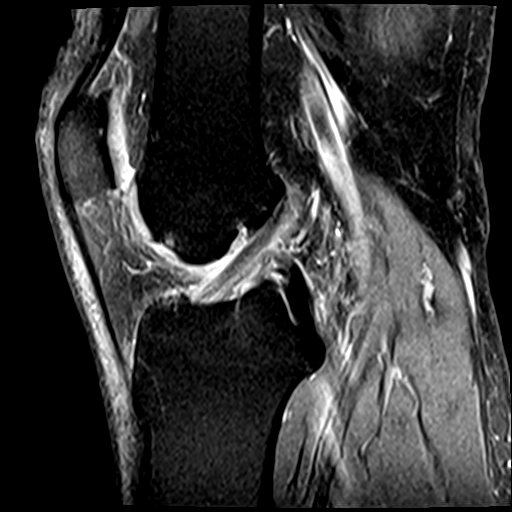
[im 18/27]
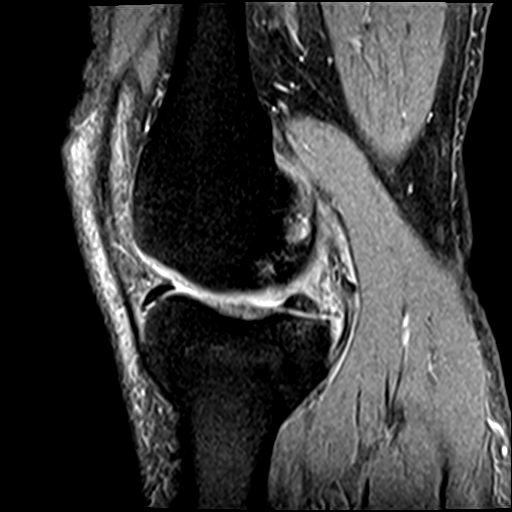
[im 22/27]
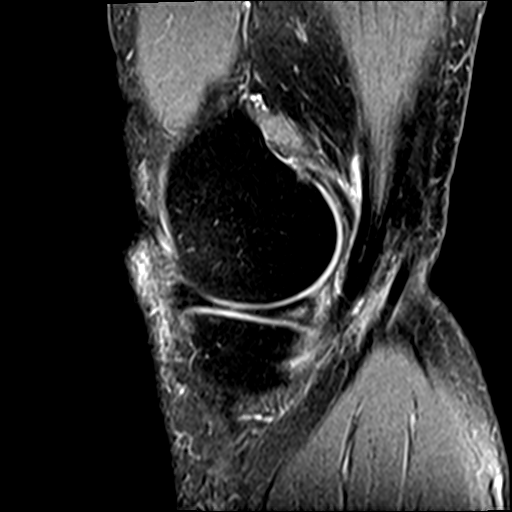
[im 27/27]
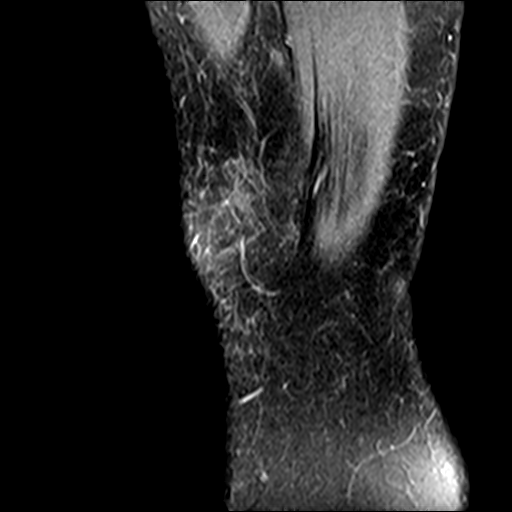

[23 of 40 positions shown; findings below may reference images not displayed]

FINDINGS: MENISCI

Medial meniscus: Complete radial tear through the posterior root.
Degenerative signal within the body and posterior horn.

Lateral meniscus:  Intact.

LIGAMENTS

Cruciates:  Intact ACL and PCL.

Collaterals: Medial collateral ligament is intact. Lateral
collateral ligament complex is intact.

CARTILAGE

Patellofemoral: Partial-thickness cartilage loss and delamination
over the patellar apex extending into the lateral facet. Focal
high-grade partial-thickness cartilage loss over the medial trochlea
with underlying subchondral marrow edema.

Medial:  No chondral defect.

Lateral:  No chondral defect.

Joint: No significant joint effusion. Normal Hoffa's fat. No plical
thickening.

Popliteal Fossa:  Tiny Baker cyst.  Intact popliteus tendon.

Extensor Mechanism: Intact quadriceps tendon and patellar tendon.
Intact medial and lateral patellar retinaculum. Intact MPFL.

Bones: Reactive marrow edema and cystic change in the posterior
medial tibial plateau near the medial meniscus posterior root. No
acute fracture or dislocation. No focal bone lesion.

Other: Partial tear of the proximal plantaris muscle.
IMPRESSION: 1. Complete radial tear through the medial meniscus posterior root.
2. Partial tear of the proximal plantaris muscle.
3. Mild patellofemoral compartment osteoarthritis.

## 2019-05-15 DIAGNOSIS — R29898 Other symptoms and signs involving the musculoskeletal system: Secondary | ICD-10-CM | POA: Diagnosis not present

## 2019-05-15 DIAGNOSIS — M50323 Other cervical disc degeneration at C6-C7 level: Secondary | ICD-10-CM | POA: Diagnosis not present

## 2019-05-15 DIAGNOSIS — Z23 Encounter for immunization: Secondary | ICD-10-CM | POA: Diagnosis not present

## 2019-05-15 DIAGNOSIS — M25529 Pain in unspecified elbow: Secondary | ICD-10-CM | POA: Diagnosis not present

## 2019-05-15 DIAGNOSIS — M25522 Pain in left elbow: Secondary | ICD-10-CM | POA: Diagnosis not present

## 2019-05-15 DIAGNOSIS — M25629 Stiffness of unspecified elbow, not elsewhere classified: Secondary | ICD-10-CM | POA: Diagnosis not present

## 2019-05-20 DIAGNOSIS — Z6834 Body mass index (BMI) 34.0-34.9, adult: Secondary | ICD-10-CM | POA: Diagnosis not present

## 2019-05-20 DIAGNOSIS — Z13220 Encounter for screening for lipoid disorders: Secondary | ICD-10-CM | POA: Diagnosis not present

## 2019-05-20 DIAGNOSIS — Z0000 Encounter for general adult medical examination without abnormal findings: Secondary | ICD-10-CM | POA: Diagnosis not present

## 2019-05-20 DIAGNOSIS — Z131 Encounter for screening for diabetes mellitus: Secondary | ICD-10-CM | POA: Diagnosis not present

## 2019-05-29 DIAGNOSIS — Z6834 Body mass index (BMI) 34.0-34.9, adult: Secondary | ICD-10-CM | POA: Diagnosis not present

## 2019-05-29 DIAGNOSIS — R29898 Other symptoms and signs involving the musculoskeletal system: Secondary | ICD-10-CM | POA: Diagnosis not present

## 2019-06-14 DIAGNOSIS — M13829 Other specified arthritis, unspecified elbow: Secondary | ICD-10-CM | POA: Diagnosis not present

## 2019-07-09 DIAGNOSIS — E669 Obesity, unspecified: Secondary | ICD-10-CM | POA: Diagnosis not present

## 2019-07-09 DIAGNOSIS — Z6834 Body mass index (BMI) 34.0-34.9, adult: Secondary | ICD-10-CM | POA: Diagnosis not present

## 2019-07-23 DIAGNOSIS — Z0000 Encounter for general adult medical examination without abnormal findings: Secondary | ICD-10-CM | POA: Diagnosis not present

## 2019-07-23 DIAGNOSIS — Z7189 Other specified counseling: Secondary | ICD-10-CM | POA: Diagnosis not present

## 2019-07-23 DIAGNOSIS — Z1339 Encounter for screening examination for other mental health and behavioral disorders: Secondary | ICD-10-CM | POA: Diagnosis not present

## 2019-07-23 DIAGNOSIS — Z1331 Encounter for screening for depression: Secondary | ICD-10-CM | POA: Diagnosis not present

## 2019-07-23 DIAGNOSIS — Z139 Encounter for screening, unspecified: Secondary | ICD-10-CM | POA: Diagnosis not present

## 2019-07-23 DIAGNOSIS — E669 Obesity, unspecified: Secondary | ICD-10-CM | POA: Diagnosis not present

## 2019-07-30 DIAGNOSIS — E669 Obesity, unspecified: Secondary | ICD-10-CM | POA: Diagnosis not present

## 2019-07-30 DIAGNOSIS — Z6834 Body mass index (BMI) 34.0-34.9, adult: Secondary | ICD-10-CM | POA: Diagnosis not present

## 2019-09-01 ENCOUNTER — Ambulatory Visit: Payer: Medicare PPO | Attending: Internal Medicine

## 2019-09-01 ENCOUNTER — Other Ambulatory Visit: Payer: Self-pay

## 2019-09-01 DIAGNOSIS — Z23 Encounter for immunization: Secondary | ICD-10-CM | POA: Insufficient documentation

## 2019-09-01 NOTE — Progress Notes (Signed)
   Covid-19 Vaccination Clinic  Name:  Jesse Johns    MRN: [id number] DOB: 1950/11/05  09/01/2019  [name] was observed post Covid-19 immunization for 15 minutes without incidence. He was provided with Vaccine Information Sheet and instruction to access the V-Safe system.   [name] was instructed to call 911 with any severe reactions post vaccine: Marland Kitchen Difficulty breathing  . Swelling of your face and throat  . A fast heartbeat  . A bad rash all over your body  . Dizziness and weakness    Immunizations Administered    Name Date Dose VIS Date Route   Pfizer COVID-19 Vaccine 09/01/2019 11:32 AM 0.3 mL 06/14/2019 Intramuscular   Manufacturer: [place]   Lot: [id number]   [place]: [id number]

## 2019-09-10 DIAGNOSIS — E669 Obesity, unspecified: Secondary | ICD-10-CM | POA: Diagnosis not present

## 2019-09-10 DIAGNOSIS — Z6834 Body mass index (BMI) 34.0-34.9, adult: Secondary | ICD-10-CM | POA: Diagnosis not present

## 2019-09-24 DIAGNOSIS — Z6834 Body mass index (BMI) 34.0-34.9, adult: Secondary | ICD-10-CM | POA: Diagnosis not present

## 2019-09-24 DIAGNOSIS — E669 Obesity, unspecified: Secondary | ICD-10-CM | POA: Diagnosis not present

## 2019-10-01 ENCOUNTER — Ambulatory Visit: Payer: Medicare PPO | Attending: Internal Medicine

## 2019-10-01 DIAGNOSIS — Z23 Encounter for immunization: Secondary | ICD-10-CM

## 2019-10-01 NOTE — Progress Notes (Signed)
   Covid-19 Vaccination Clinic  Name:  Jesse Johns    MRN: [id number] DOB: [birth year]  10/01/2019  [name] was observed post Covid-19 immunization for 15 minutes without incident. He was provided with Vaccine Information Sheet and instruction to access the V-Safe system.   [name] was instructed to call 911 with any severe reactions post vaccine: Marland Kitchen Difficulty breathing  . Swelling of face and throat  . A fast heartbeat  . A bad rash all over body  . Dizziness and weakness   Immunizations Administered    Name Date Dose VIS Date Route   Pfizer COVID-19 Vaccine 10/01/2019  9:00 AM 0.3 mL 06/14/2019 Intramuscular   Manufacturer: [place]   Lot: [id number]   [place]: [id number]

## 2019-10-08 DIAGNOSIS — Z6834 Body mass index (BMI) 34.0-34.9, adult: Secondary | ICD-10-CM | POA: Diagnosis not present

## 2019-10-08 DIAGNOSIS — E669 Obesity, unspecified: Secondary | ICD-10-CM | POA: Diagnosis not present

## 2019-10-22 DIAGNOSIS — Z6834 Body mass index (BMI) 34.0-34.9, adult: Secondary | ICD-10-CM | POA: Diagnosis not present

## 2019-10-22 DIAGNOSIS — E669 Obesity, unspecified: Secondary | ICD-10-CM | POA: Diagnosis not present

## 2019-11-05 DIAGNOSIS — Z6834 Body mass index (BMI) 34.0-34.9, adult: Secondary | ICD-10-CM | POA: Diagnosis not present

## 2019-11-05 DIAGNOSIS — E669 Obesity, unspecified: Secondary | ICD-10-CM | POA: Diagnosis not present

## 2019-11-07 DIAGNOSIS — L821 Other seborrheic keratosis: Secondary | ICD-10-CM | POA: Diagnosis not present

## 2019-11-07 DIAGNOSIS — L820 Inflamed seborrheic keratosis: Secondary | ICD-10-CM | POA: Diagnosis not present

## 2019-11-07 DIAGNOSIS — L570 Actinic keratosis: Secondary | ICD-10-CM | POA: Diagnosis not present

## 2019-11-07 DIAGNOSIS — L578 Other skin changes due to chronic exposure to nonionizing radiation: Secondary | ICD-10-CM | POA: Diagnosis not present

## 2019-11-19 DIAGNOSIS — E669 Obesity, unspecified: Secondary | ICD-10-CM | POA: Diagnosis not present

## 2019-11-19 DIAGNOSIS — Z6834 Body mass index (BMI) 34.0-34.9, adult: Secondary | ICD-10-CM | POA: Diagnosis not present

## 2019-12-10 DIAGNOSIS — E669 Obesity, unspecified: Secondary | ICD-10-CM | POA: Diagnosis not present

## 2019-12-10 DIAGNOSIS — Z6834 Body mass index (BMI) 34.0-34.9, adult: Secondary | ICD-10-CM | POA: Diagnosis not present

## 2020-01-16 DIAGNOSIS — R1310 Dysphagia, unspecified: Secondary | ICD-10-CM | POA: Diagnosis not present

## 2020-01-16 DIAGNOSIS — T17320A Food in larynx causing asphyxiation, initial encounter: Secondary | ICD-10-CM | POA: Diagnosis not present

## 2020-01-16 DIAGNOSIS — Z6833 Body mass index (BMI) 33.0-33.9, adult: Secondary | ICD-10-CM | POA: Diagnosis not present

## 2020-05-19 DIAGNOSIS — L578 Other skin changes due to chronic exposure to nonionizing radiation: Secondary | ICD-10-CM | POA: Diagnosis not present

## 2020-05-19 DIAGNOSIS — C44321 Squamous cell carcinoma of skin of nose: Secondary | ICD-10-CM | POA: Diagnosis not present

## 2020-05-19 DIAGNOSIS — C44329 Squamous cell carcinoma of skin of other parts of face: Secondary | ICD-10-CM | POA: Diagnosis not present

## 2020-06-10 DIAGNOSIS — C44321 Squamous cell carcinoma of skin of nose: Secondary | ICD-10-CM | POA: Diagnosis not present

## 2020-06-16 DIAGNOSIS — Z20822 Contact with and (suspected) exposure to covid-19: Secondary | ICD-10-CM | POA: Diagnosis not present

## 2020-09-22 DIAGNOSIS — D485 Neoplasm of uncertain behavior of skin: Secondary | ICD-10-CM | POA: Diagnosis not present

## 2020-09-22 DIAGNOSIS — L570 Actinic keratosis: Secondary | ICD-10-CM | POA: Diagnosis not present

## 2020-09-22 DIAGNOSIS — L578 Other skin changes due to chronic exposure to nonionizing radiation: Secondary | ICD-10-CM | POA: Diagnosis not present

## 2020-09-22 DIAGNOSIS — L821 Other seborrheic keratosis: Secondary | ICD-10-CM | POA: Diagnosis not present

## 2020-12-22 DIAGNOSIS — Z1331 Encounter for screening for depression: Secondary | ICD-10-CM | POA: Diagnosis not present

## 2020-12-22 DIAGNOSIS — R7303 Prediabetes: Secondary | ICD-10-CM | POA: Diagnosis not present

## 2020-12-22 DIAGNOSIS — Z13220 Encounter for screening for lipoid disorders: Secondary | ICD-10-CM | POA: Diagnosis not present

## 2020-12-22 DIAGNOSIS — Z125 Encounter for screening for malignant neoplasm of prostate: Secondary | ICD-10-CM | POA: Diagnosis not present

## 2020-12-22 DIAGNOSIS — E669 Obesity, unspecified: Secondary | ICD-10-CM | POA: Diagnosis not present

## 2020-12-22 DIAGNOSIS — Z1339 Encounter for screening examination for other mental health and behavioral disorders: Secondary | ICD-10-CM | POA: Diagnosis not present

## 2020-12-22 DIAGNOSIS — Z6834 Body mass index (BMI) 34.0-34.9, adult: Secondary | ICD-10-CM | POA: Diagnosis not present

## 2020-12-22 DIAGNOSIS — Z1389 Encounter for screening for other disorder: Secondary | ICD-10-CM | POA: Diagnosis not present

## 2020-12-22 DIAGNOSIS — Z0000 Encounter for general adult medical examination without abnormal findings: Secondary | ICD-10-CM | POA: Diagnosis not present

## 2020-12-22 DIAGNOSIS — Z136 Encounter for screening for cardiovascular disorders: Secondary | ICD-10-CM | POA: Diagnosis not present

## 2021-01-22 DIAGNOSIS — L578 Other skin changes due to chronic exposure to nonionizing radiation: Secondary | ICD-10-CM | POA: Diagnosis not present

## 2021-01-22 DIAGNOSIS — L821 Other seborrheic keratosis: Secondary | ICD-10-CM | POA: Diagnosis not present

## 2021-01-22 DIAGNOSIS — L820 Inflamed seborrheic keratosis: Secondary | ICD-10-CM | POA: Diagnosis not present

## 2021-01-22 DIAGNOSIS — L570 Actinic keratosis: Secondary | ICD-10-CM | POA: Diagnosis not present

## 2021-01-22 DIAGNOSIS — C44321 Squamous cell carcinoma of skin of nose: Secondary | ICD-10-CM | POA: Diagnosis not present

## 2021-06-01 DIAGNOSIS — L570 Actinic keratosis: Secondary | ICD-10-CM | POA: Diagnosis not present

## 2021-06-01 DIAGNOSIS — L821 Other seborrheic keratosis: Secondary | ICD-10-CM | POA: Diagnosis not present

## 2021-06-01 DIAGNOSIS — C44321 Squamous cell carcinoma of skin of nose: Secondary | ICD-10-CM | POA: Diagnosis not present

## 2021-06-01 DIAGNOSIS — L820 Inflamed seborrheic keratosis: Secondary | ICD-10-CM | POA: Diagnosis not present

## 2021-09-09 DIAGNOSIS — R0683 Snoring: Secondary | ICD-10-CM | POA: Diagnosis not present

## 2021-09-09 DIAGNOSIS — J3089 Other allergic rhinitis: Secondary | ICD-10-CM | POA: Diagnosis not present

## 2021-09-09 DIAGNOSIS — Z20822 Contact with and (suspected) exposure to covid-19: Secondary | ICD-10-CM | POA: Diagnosis not present

## 2021-09-09 DIAGNOSIS — Z6834 Body mass index (BMI) 34.0-34.9, adult: Secondary | ICD-10-CM | POA: Diagnosis not present

## 2021-09-09 DIAGNOSIS — R0981 Nasal congestion: Secondary | ICD-10-CM | POA: Diagnosis not present

## 2021-09-15 DIAGNOSIS — R0683 Snoring: Secondary | ICD-10-CM | POA: Diagnosis not present

## 2021-09-23 DIAGNOSIS — Z6834 Body mass index (BMI) 34.0-34.9, adult: Secondary | ICD-10-CM | POA: Diagnosis not present

## 2021-09-23 DIAGNOSIS — G4730 Sleep apnea, unspecified: Secondary | ICD-10-CM | POA: Diagnosis not present

## 2021-09-27 DIAGNOSIS — L578 Other skin changes due to chronic exposure to nonionizing radiation: Secondary | ICD-10-CM | POA: Diagnosis not present

## 2021-09-27 DIAGNOSIS — L821 Other seborrheic keratosis: Secondary | ICD-10-CM | POA: Diagnosis not present

## 2021-09-27 DIAGNOSIS — L570 Actinic keratosis: Secondary | ICD-10-CM | POA: Diagnosis not present

## 2021-10-26 DIAGNOSIS — G4730 Sleep apnea, unspecified: Secondary | ICD-10-CM | POA: Diagnosis not present

## 2021-11-17 DIAGNOSIS — G4733 Obstructive sleep apnea (adult) (pediatric): Secondary | ICD-10-CM | POA: Diagnosis not present

## 2021-11-17 DIAGNOSIS — Z6834 Body mass index (BMI) 34.0-34.9, adult: Secondary | ICD-10-CM | POA: Diagnosis not present

## 2021-12-23 DIAGNOSIS — Z125 Encounter for screening for malignant neoplasm of prostate: Secondary | ICD-10-CM | POA: Diagnosis not present

## 2021-12-23 DIAGNOSIS — E782 Mixed hyperlipidemia: Secondary | ICD-10-CM | POA: Diagnosis not present

## 2021-12-23 DIAGNOSIS — R7303 Prediabetes: Secondary | ICD-10-CM | POA: Diagnosis not present

## 2021-12-29 DIAGNOSIS — Z139 Encounter for screening, unspecified: Secondary | ICD-10-CM | POA: Diagnosis not present

## 2021-12-29 DIAGNOSIS — Z6834 Body mass index (BMI) 34.0-34.9, adult: Secondary | ICD-10-CM | POA: Diagnosis not present

## 2021-12-29 DIAGNOSIS — E782 Mixed hyperlipidemia: Secondary | ICD-10-CM | POA: Diagnosis not present

## 2021-12-29 DIAGNOSIS — Z23 Encounter for immunization: Secondary | ICD-10-CM | POA: Diagnosis not present

## 2021-12-29 DIAGNOSIS — R7303 Prediabetes: Secondary | ICD-10-CM | POA: Diagnosis not present

## 2021-12-29 DIAGNOSIS — Z1331 Encounter for screening for depression: Secondary | ICD-10-CM | POA: Diagnosis not present

## 2021-12-29 DIAGNOSIS — E669 Obesity, unspecified: Secondary | ICD-10-CM | POA: Diagnosis not present

## 2021-12-29 DIAGNOSIS — Z0000 Encounter for general adult medical examination without abnormal findings: Secondary | ICD-10-CM | POA: Diagnosis not present

## 2021-12-29 DIAGNOSIS — Z0189 Encounter for other specified special examinations: Secondary | ICD-10-CM | POA: Diagnosis not present

## 2021-12-29 DIAGNOSIS — Z1339 Encounter for screening examination for other mental health and behavioral disorders: Secondary | ICD-10-CM | POA: Diagnosis not present

## 2022-01-17 DIAGNOSIS — S0502XA Injury of conjunctiva and corneal abrasion without foreign body, left eye, initial encounter: Secondary | ICD-10-CM | POA: Diagnosis not present

## 2022-01-18 DIAGNOSIS — S0502XD Injury of conjunctiva and corneal abrasion without foreign body, left eye, subsequent encounter: Secondary | ICD-10-CM | POA: Diagnosis not present

## 2022-03-28 DIAGNOSIS — L280 Lichen simplex chronicus: Secondary | ICD-10-CM | POA: Diagnosis not present

## 2022-03-28 DIAGNOSIS — L821 Other seborrheic keratosis: Secondary | ICD-10-CM | POA: Diagnosis not present

## 2022-03-28 DIAGNOSIS — R233 Spontaneous ecchymoses: Secondary | ICD-10-CM | POA: Diagnosis not present

## 2022-03-28 DIAGNOSIS — D485 Neoplasm of uncertain behavior of skin: Secondary | ICD-10-CM | POA: Diagnosis not present

## 2022-03-28 DIAGNOSIS — L570 Actinic keratosis: Secondary | ICD-10-CM | POA: Diagnosis not present

## 2022-06-09 DIAGNOSIS — E782 Mixed hyperlipidemia: Secondary | ICD-10-CM | POA: Diagnosis not present

## 2022-06-09 DIAGNOSIS — R7303 Prediabetes: Secondary | ICD-10-CM | POA: Diagnosis not present

## 2022-06-15 DIAGNOSIS — Z23 Encounter for immunization: Secondary | ICD-10-CM | POA: Diagnosis not present

## 2022-06-15 DIAGNOSIS — R7303 Prediabetes: Secondary | ICD-10-CM | POA: Diagnosis not present

## 2022-06-15 DIAGNOSIS — Z139 Encounter for screening, unspecified: Secondary | ICD-10-CM | POA: Diagnosis not present

## 2022-06-15 DIAGNOSIS — Z6834 Body mass index (BMI) 34.0-34.9, adult: Secondary | ICD-10-CM | POA: Diagnosis not present

## 2022-06-15 DIAGNOSIS — E782 Mixed hyperlipidemia: Secondary | ICD-10-CM | POA: Diagnosis not present

## 2022-07-28 DIAGNOSIS — G4733 Obstructive sleep apnea (adult) (pediatric): Secondary | ICD-10-CM | POA: Diagnosis not present

## 2022-07-30 DIAGNOSIS — G4733 Obstructive sleep apnea (adult) (pediatric): Secondary | ICD-10-CM | POA: Diagnosis not present

## 2022-08-05 DIAGNOSIS — G4733 Obstructive sleep apnea (adult) (pediatric): Secondary | ICD-10-CM | POA: Diagnosis not present

## 2022-08-30 DIAGNOSIS — G4733 Obstructive sleep apnea (adult) (pediatric): Secondary | ICD-10-CM | POA: Diagnosis not present

## 2022-09-26 DIAGNOSIS — L578 Other skin changes due to chronic exposure to nonionizing radiation: Secondary | ICD-10-CM | POA: Diagnosis not present

## 2022-09-26 DIAGNOSIS — L570 Actinic keratosis: Secondary | ICD-10-CM | POA: Diagnosis not present

## 2022-09-26 DIAGNOSIS — L820 Inflamed seborrheic keratosis: Secondary | ICD-10-CM | POA: Diagnosis not present

## 2022-09-26 DIAGNOSIS — L219 Seborrheic dermatitis, unspecified: Secondary | ICD-10-CM | POA: Diagnosis not present

## 2022-09-26 DIAGNOSIS — L821 Other seborrheic keratosis: Secondary | ICD-10-CM | POA: Diagnosis not present

## 2022-09-28 DIAGNOSIS — G4733 Obstructive sleep apnea (adult) (pediatric): Secondary | ICD-10-CM | POA: Diagnosis not present

## 2022-10-29 DIAGNOSIS — G4733 Obstructive sleep apnea (adult) (pediatric): Secondary | ICD-10-CM | POA: Diagnosis not present

## 2022-11-28 DIAGNOSIS — G4733 Obstructive sleep apnea (adult) (pediatric): Secondary | ICD-10-CM | POA: Diagnosis not present

## 2022-12-13 DIAGNOSIS — R7303 Prediabetes: Secondary | ICD-10-CM | POA: Diagnosis not present

## 2022-12-13 DIAGNOSIS — E782 Mixed hyperlipidemia: Secondary | ICD-10-CM | POA: Diagnosis not present

## 2022-12-14 DIAGNOSIS — Z6835 Body mass index (BMI) 35.0-35.9, adult: Secondary | ICD-10-CM | POA: Diagnosis not present

## 2022-12-14 DIAGNOSIS — S30861A Insect bite (nonvenomous) of abdominal wall, initial encounter: Secondary | ICD-10-CM | POA: Diagnosis not present

## 2022-12-14 DIAGNOSIS — W57XXXA Bitten or stung by nonvenomous insect and other nonvenomous arthropods, initial encounter: Secondary | ICD-10-CM | POA: Diagnosis not present

## 2022-12-22 DIAGNOSIS — G4733 Obstructive sleep apnea (adult) (pediatric): Secondary | ICD-10-CM | POA: Diagnosis not present

## 2022-12-22 DIAGNOSIS — E782 Mixed hyperlipidemia: Secondary | ICD-10-CM | POA: Diagnosis not present

## 2022-12-22 DIAGNOSIS — R7301 Impaired fasting glucose: Secondary | ICD-10-CM | POA: Diagnosis not present

## 2022-12-22 DIAGNOSIS — Z6835 Body mass index (BMI) 35.0-35.9, adult: Secondary | ICD-10-CM | POA: Diagnosis not present

## 2022-12-29 DIAGNOSIS — G4733 Obstructive sleep apnea (adult) (pediatric): Secondary | ICD-10-CM | POA: Diagnosis not present

## 2023-01-24 DIAGNOSIS — Z1331 Encounter for screening for depression: Secondary | ICD-10-CM | POA: Diagnosis not present

## 2023-01-24 DIAGNOSIS — Z1339 Encounter for screening examination for other mental health and behavioral disorders: Secondary | ICD-10-CM | POA: Diagnosis not present

## 2023-01-24 DIAGNOSIS — Z1389 Encounter for screening for other disorder: Secondary | ICD-10-CM | POA: Diagnosis not present

## 2023-01-24 DIAGNOSIS — Z139 Encounter for screening, unspecified: Secondary | ICD-10-CM | POA: Diagnosis not present

## 2023-01-24 DIAGNOSIS — Z0000 Encounter for general adult medical examination without abnormal findings: Secondary | ICD-10-CM | POA: Diagnosis not present

## 2023-01-24 DIAGNOSIS — Z6835 Body mass index (BMI) 35.0-35.9, adult: Secondary | ICD-10-CM | POA: Diagnosis not present

## 2023-01-28 DIAGNOSIS — G4733 Obstructive sleep apnea (adult) (pediatric): Secondary | ICD-10-CM | POA: Diagnosis not present

## 2023-03-30 DIAGNOSIS — L820 Inflamed seborrheic keratosis: Secondary | ICD-10-CM | POA: Diagnosis not present

## 2023-03-30 DIAGNOSIS — L570 Actinic keratosis: Secondary | ICD-10-CM | POA: Diagnosis not present

## 2023-03-30 DIAGNOSIS — L821 Other seborrheic keratosis: Secondary | ICD-10-CM | POA: Diagnosis not present

## 2023-03-30 DIAGNOSIS — L578 Other skin changes due to chronic exposure to nonionizing radiation: Secondary | ICD-10-CM | POA: Diagnosis not present

## 2023-05-02 DIAGNOSIS — G4733 Obstructive sleep apnea (adult) (pediatric): Secondary | ICD-10-CM | POA: Diagnosis not present

## 2023-06-26 DIAGNOSIS — E782 Mixed hyperlipidemia: Secondary | ICD-10-CM | POA: Diagnosis not present

## 2023-08-16 ENCOUNTER — Institutional Professional Consult (permissible substitution) (INDEPENDENT_AMBULATORY_CARE_PROVIDER_SITE_OTHER): Payer: No Typology Code available for payment source | Admitting: Otolaryngology

## 2023-08-16 NOTE — Progress Notes (Deleted)
 ENT CONSULT:  Reason for Consult: OSA, CPAP intolerance    HPI: Jesse Johns is an 73 y.o. male with hx   Records Reviewed:  ***    Past Medical History:  Diagnosis Date   Allergy    hayfever, cats   Arthritis    toe   Asthma 10/30/2013    Past Surgical History:  Procedure Laterality Date   COLONOSCOPY  03/25/2013   ELBOW SURGERY Right    KNEE ARTHROSCOPY W/ MENISCAL REPAIR  06/13/2018   POLYPECTOMY     TONSILLECTOMY AND ADENOIDECTOMY      Family History  Problem Relation Age of Onset   Colon cancer Neg Hx    Esophageal cancer Neg Hx    Rectal cancer Neg Hx    Stomach cancer Neg Hx    Colon polyps Neg Hx     Social History:  reports that he has never smoked. He has never used smokeless tobacco. He reports current alcohol use of about 2.0 standard drinks of alcohol per week. He reports that he does not currently use drugs.  Allergies: No Known Allergies  Medications: I have reviewed the patient's current medications.  The PMH, PSH, Medications, Allergies, and SH were reviewed and updated.  ROS: Constitutional: Negative for fever, weight loss and weight gain. Cardiovascular: Negative for chest pain and dyspnea on exertion. Respiratory: Is not experiencing shortness of breath at rest. Gastrointestinal: Negative for nausea and vomiting. Neurological: Negative for headaches. Psychiatric: The patient is not nervous/anxious  There were no vitals taken for this visit.  PHYSICAL EXAM:  Exam: General: Well-developed, well-nourished Communication and Voice: Clear pitch and clarity Respiratory Respiratory effort: Equal inspiration and expiration without stridor Cardiovascular Peripheral Vascular: Warm extremities with equal color/perfusion Eyes: No nystagmus with equal extraocular motion bilaterally Neuro/Psych/Balance: Patient oriented to person, place, and time; Appropriate mood and affect; Gait is intact with no imbalance; Cranial nerves I-XII are intact Head  and Face Inspection: Normocephalic and atraumatic without mass or lesion Palpation: Facial skeleton intact without bony stepoffs Salivary Glands: No mass or tenderness Facial Strength: Facial motility symmetric and full bilaterally ENT Pinna: External ear intact and fully developed External canal: Canal is patent with intact skin Tympanic Membrane: Clear and mobile External Nose: No scar or anatomic deformity Internal Nose: Septum is ***. No polyp, or purulence. Mucosal edema and erythema present.  Bilateral inferior turbinate hypertrophy.  Lips, Teeth, and gums: Mucosa and teeth intact and viable TMJ: No pain to palpation with full mobility Oral cavity/oropharynx: No erythema or exudate, no lesions present Nasopharynx: No mass or lesion with intact mucosa Hypopharynx: Intact mucosa without pooling of secretions Larynx Glottic: Full true vocal cord mobility without lesion or mass Supraglottic: Normal appearing epiglottis and AE folds Interarytenoid Space: Moderate pachydermia&edema Subglottic Space: Patent without lesion or edema Neck Neck and Trachea: Midline trachea without mass or lesion Thyroid: No mass or nodularity Lymphatics: No lymphadenopathy  Procedure:      Studies Reviewed:***  Assessment/Plan: No diagnosis found.  ***  Thank you for allowing me to participate in the care of this patient. Please do not hesitate to contact me with any questions or concerns.   Ashok Croon, MD Otolaryngology [facility] Phone: [phone] Fax: [phone]    08/16/2023, 11:26 AM

## 2023-08-30 DIAGNOSIS — R059 Cough, unspecified: Secondary | ICD-10-CM | POA: Diagnosis not present

## 2023-08-30 DIAGNOSIS — Z6835 Body mass index (BMI) 35.0-35.9, adult: Secondary | ICD-10-CM | POA: Diagnosis not present

## 2023-08-30 DIAGNOSIS — J101 Influenza due to other identified influenza virus with other respiratory manifestations: Secondary | ICD-10-CM | POA: Diagnosis not present

## 2023-08-30 DIAGNOSIS — Z20822 Contact with and (suspected) exposure to covid-19: Secondary | ICD-10-CM | POA: Diagnosis not present

## 2023-09-14 ENCOUNTER — Ambulatory Visit (INDEPENDENT_AMBULATORY_CARE_PROVIDER_SITE_OTHER): Payer: Self-pay | Admitting: Otolaryngology

## 2023-09-14 ENCOUNTER — Encounter (INDEPENDENT_AMBULATORY_CARE_PROVIDER_SITE_OTHER): Payer: Self-pay | Admitting: Otolaryngology

## 2023-09-14 VITALS — BP 167/79 | Ht 68.5 in | Wt 228.0 lb

## 2023-09-14 DIAGNOSIS — Z91198 Patient's noncompliance with other medical treatment and regimen for other reason: Secondary | ICD-10-CM

## 2023-09-14 DIAGNOSIS — G4733 Obstructive sleep apnea (adult) (pediatric): Secondary | ICD-10-CM

## 2023-09-14 DIAGNOSIS — R0981 Nasal congestion: Secondary | ICD-10-CM | POA: Diagnosis not present

## 2023-09-14 DIAGNOSIS — Z789 Other specified health status: Secondary | ICD-10-CM

## 2023-09-14 DIAGNOSIS — J343 Hypertrophy of nasal turbinates: Secondary | ICD-10-CM

## 2023-09-14 DIAGNOSIS — R040 Epistaxis: Secondary | ICD-10-CM

## 2023-09-14 DIAGNOSIS — J342 Deviated nasal septum: Secondary | ICD-10-CM

## 2023-09-14 DIAGNOSIS — J3089 Other allergic rhinitis: Secondary | ICD-10-CM

## 2023-09-14 NOTE — Progress Notes (Signed)
 ENT CONSULT:  Reason for Consult: OSA CPAP intolerance   Ref provider: Charlott Rakes, MD PCP    HPI: Discussed the use of AI scribe software for clinical note transcription with the patient, who gave verbal consent to proceed.  History of Present Illness   The patient is a 79 yoM with hx of severe obstructive sleep apnea, presents with difficulty tolerating CPAP therapy and seeking alternatives. He was referred by his primary care doctor for evaluation.  He has a history of severe sleep apnea diagnosed via a home sleep test conducted a couple of years ago. He is currently using CPAP therapy but finds it uncomfortable and difficult to manage, particularly due to issues with mask fit and movement during sleep. The CPAP machine has been pulled off the nightstand during sleep several times, causing disturbances. He reports poor quality of sleep and trouble with CPAP mask in place due to discomfort. Tried various masks. He is interested in exploring alternatives to CPAP therapy.  He experiences chronic nasal congestion. Had a flu three weeks ago, which led to a temporary discontinuation of CPAP use for two weeks because of epistaxis and worsening of his baseline nasal congestion. He occasionally uses allergy medication for his nasal congestion but not every Welp.  He reports a recent minor weight loss during his flu illness but notes a general trend of weight gain over time despite not eating much, attributing it to a lack of exercise. No history of heart disease, arrhythmia, or strokes/MI. He is not on any blood thinners. BMI today 34. Denies hx of insomnia.        Past Medical History:  Diagnosis Date   Allergy    hayfever, cats   Arthritis    toe   Asthma 10/30/2013    Past Surgical History:  Procedure Laterality Date   COLONOSCOPY  03/25/2013   ELBOW SURGERY Right    KNEE ARTHROSCOPY W/ MENISCAL REPAIR  06/13/2018   POLYPECTOMY     TONSILLECTOMY AND ADENOIDECTOMY      Family  History  Problem Relation Age of Onset   Colon cancer Neg Hx    Esophageal cancer Neg Hx    Rectal cancer Neg Hx    Stomach cancer Neg Hx    Colon polyps Neg Hx     Social History:  reports that he has never smoked. He has never used smokeless tobacco. He reports current alcohol use of about 2.0 standard drinks of alcohol per week. He reports that he does not currently use drugs.  Allergies: No Known Allergies  Medications: I have reviewed the patient's current medications.  The PMH, PSH, Medications, Allergies, and SH were reviewed and updated.  ROS: Constitutional: Negative for fever, weight loss and weight gain. Cardiovascular: Negative for chest pain and dyspnea on exertion. Respiratory: Is not experiencing shortness of breath at rest. Gastrointestinal: Negative for nausea and vomiting. Neurological: Negative for headaches. Psychiatric: The patient is not nervous/anxious  Blood pressure (!) 167/79, height 5' 8.5" (1.74 m), weight 228 lb (103.4 kg), SpO2 98%. Body mass index is 34.16 kg/m.  PHYSICAL EXAM:  Exam: General: Well-developed, well-nourished Respiratory Respiratory effort: Equal inspiration and expiration without stridor Cardiovascular Peripheral Vascular: Warm extremities with equal color/perfusion Eyes: No nystagmus with equal extraocular motion bilaterally Neuro/Psych/Balance: Patient oriented to person, place, and time; Appropriate mood and affect; Gait is intact with no imbalance; Cranial nerves I-XII are intact Head and Face Inspection: Normocephalic and atraumatic without mass or lesion Palpation: Facial skeleton intact without bony  stepoffs Salivary Glands: No mass or tenderness Facial Strength: Facial motility symmetric and full bilaterally ENT Pinna: External ear intact and fully developed External canal: Canal is patent with intact skin Tympanic Membrane: Clear and mobile External Nose: No scar or anatomic deformity Internal Nose: Septum is  deviated to the left. No polyp, or purulence. Mucosal edema and erythema present. No epistaxis but small area of ulceration with dry blood along right anterior septum. Bilateral inferior turbinate hypertrophy.  Lips, Teeth, and gums: Mucosa and teeth intact and viable TMJ: No pain to palpation with full mobility Oral cavity/oropharynx: No erythema or exudate, no lesions present Friedman IV tongue position no tonsils Nasopharynx: No mass or lesion with intact mucosa Hypopharynx: Intact mucosa without pooling of secretions Larynx Glottic: Full true vocal cord mobility without lesion or mass Supraglottic: Normal appearing epiglottis and AE folds Interarytenoid Space: Moderate pachydermia&edema Subglottic Space: Patent without lesion or edema Neck Neck and Trachea: Midline trachea without mass or lesion Thyroid: No mass or nodularity Lymphatics: No lymphadenopathy  Procedure: Preoperative diagnosis: OSA CPAP intolerance   Postoperative diagnosis:   Same  Procedure: Flexible fiberoptic laryngoscopy  Surgeon: Ashok Croon, MD  Anesthesia: Topical lidocaine and Afrin Complications: None Condition is stable throughout exam  Indications and consent:  The patient presents to the clinic with above symptoms. Indirect laryngoscopy view was incomplete. Thus it was recommended that they undergo a flexible fiberoptic laryngoscopy. All of the risks, benefits, and potential complications were reviewed with the patient preoperatively and verbal informed consent was obtained.  Procedure: The patient was seated upright in the clinic. Topical lidocaine and Afrin were applied to the nasal cavity. After adequate anesthesia had occurred, I then proceeded to pass the flexible telescope into the nasal cavity. The nasal cavity was patent without rhinorrhea or polyp. The nasopharynx was also patent without mass or lesion. The base of tongue was visualized and was normal. There were no signs of pooling of  secretions in the piriform sinuses. The true vocal folds were mobile bilaterally. There were no signs of glottic or supraglottic mucosal lesion or mass. There was moderate interarytenoid pachydermia and post cricoid edema. The telescope was then slowly withdrawn and the patient tolerated the procedure throughout.    PROCEDURE NOTE: nasal endoscopy  Preoperative diagnosis: chronic nasal congestion symptoms  Postoperative diagnosis: same  Procedure: Diagnostic nasal endoscopy (40981)  Surgeon: Ashok Croon, M.D.  Anesthesia: Topical lidocaine and Afrin  H&P REVIEW: The patient's history and physical were reviewed today prior to procedure. All medications were reviewed and updated as well. Complications: None Condition is stable throughout exam Indications and consent: The patient presents with symptoms of chronic sinusitis not responding to previous therapies. All the risks, benefits, and potential complications were reviewed with the patient preoperatively and informed consent was obtained. The time out was completed with confirmation of the correct procedure.   Procedure: The patient was seated upright in the clinic. Topical lidocaine and Afrin were applied to the nasal cavity. After adequate anesthesia had occurred, the rigid nasal endoscope was passed into the nasal cavity. The nasal mucosa, turbinates, septum, and sinus drainage pathways were visualized bilaterally. This revealed no purulence or significant secretions that might be cultured. There were no polyps or sites of significant inflammation. No epistaxis but small area of ulceration with dry blood along right anterior septum. The mucosa was intact and there was no crusting present. The scope was then slowly withdrawn and the patient tolerated the procedure well. There were no complications or blood  loss.    Assessment/Plan: Encounter Diagnoses  Name Primary?   OSA (obstructive sleep apnea) Yes   Intolerance of continuous  positive airway pressure (CPAP) ventilation    Chronic nasal congestion    Environmental and seasonal allergies    Epistaxis    Nasal septal deviation    Hypertrophy of both inferior nasal turbinates     Assessment and Plan    Obstructive Sleep Apnea (OSA)   Severe OSA diagnosed via home sleep test 2 yrs ago. CPAP therapy is uncomfortable due to mask fit issues and dislodgement during sleep, poor quality of sleep. He is interested in the Goltry Implant. Candidacy criteria include a recent sleep study, AHI >15, <25% central apneas, and no complete concentric collapse at the soft palate during DISE.. Oral appliance  noist recommended for severe OSA due to limited efficacy.    OSA, severe, without multilevel collapse, with failure to tolerate PAP therapy and/or more conservative measures. Presence of smaller/absent tonsils and larger tongue position (Friedman tongue position or modified Mallampati) suggests that hypopharyngeal/retrolingual collapse is contributing to the patient's OSA. Zachery Conch, M et al. Staging of obstructive sleep apnea/hypopnea syndrome: a guide to appropriate treatment. Laryngoscope, 2004 Mar, 114(3):454-9. PMID: [id number]) Options including positional therapy, weight loss, oral appliances, PAP and surgical correction discussed. Pt is not an ideal candidate for oral appliance due to severity of OSA Pt could be a candidate for Hypoglossal nerve stimulation (Inspire therapy) pending DISE and sleep study review    - Refer to [facility] for sleep study and follow-up care.   - Request prior sleep study results from primary care physician.   - Schedule drug-induced sleep endoscopy (DISE) to assess for complete concentric collapse.   - Provide Inspire Implant brochure and link to connect with a patient who has undergone the procedure.   - resume CPAP use for now  Chronic Nasal Congestion   Persistent nasal congestion following recent influenza. Occasional use of allergy  medication for symptom management in the past. Nasal endoscopy with septal deviation to the left and ITH, no polyps or purulence, no signs of bacterial sinusitis. No epistaxis or areas that require nasal cautery on exam today.  - Consider restarting Claritin 10 mg daily and nasal saline rinses  - consider trial of Flonase (with caution) due to recent episode of epistaxis   Epistaxis, self resolved   Recent left-sided epistaxis, likely due to nasal congestion and coughing from recent influenza. No persistent epistaxis or areas that require cautery on nasal endoscopy today - Advised on nasal care and use of saline spray Vaseline to prevent epistaxis      Thank you for allowing me to participate in the care of this patient. Please do not hesitate to contact me with any questions or concerns.   Ashok Croon, MD Otolaryngology [facility] Phone: [phone] Fax: [phone]    09/14/2023, 9:25 AM

## 2023-09-14 NOTE — H&P (View-Only) (Signed)
 ENT CONSULT:  Reason for Consult: OSA CPAP intolerance   Ref provider: Charlott Rakes, MD PCP    HPI: Discussed the use of AI scribe software for clinical note transcription with the patient, who gave verbal consent to proceed.  History of Present Illness   The patient is a 79 yoM with hx of severe obstructive sleep apnea, presents with difficulty tolerating CPAP therapy and seeking alternatives. He was referred by his primary care doctor for evaluation.  He has a history of severe sleep apnea diagnosed via a home sleep test conducted a couple of years ago. He is currently using CPAP therapy but finds it uncomfortable and difficult to manage, particularly due to issues with mask fit and movement during sleep. The CPAP machine has been pulled off the nightstand during sleep several times, causing disturbances. He reports poor quality of sleep and trouble with CPAP mask in place due to discomfort. Tried various masks. He is interested in exploring alternatives to CPAP therapy.  He experiences chronic nasal congestion. Had a flu three weeks ago, which led to a temporary discontinuation of CPAP use for two weeks because of epistaxis and worsening of his baseline nasal congestion. He occasionally uses allergy medication for his nasal congestion but not every Welp.  He reports a recent minor weight loss during his flu illness but notes a general trend of weight gain over time despite not eating much, attributing it to a lack of exercise. No history of heart disease, arrhythmia, or strokes/MI. He is not on any blood thinners. BMI today 34. Denies hx of insomnia.        Past Medical History:  Diagnosis Date   Allergy    hayfever, cats   Arthritis    toe   Asthma 10/30/2013    Past Surgical History:  Procedure Laterality Date   COLONOSCOPY  03/25/2013   ELBOW SURGERY Right    KNEE ARTHROSCOPY W/ MENISCAL REPAIR  06/13/2018   POLYPECTOMY     TONSILLECTOMY AND ADENOIDECTOMY      Family  History  Problem Relation Age of Onset   Colon cancer Neg Hx    Esophageal cancer Neg Hx    Rectal cancer Neg Hx    Stomach cancer Neg Hx    Colon polyps Neg Hx     Social History:  reports that he has never smoked. He has never used smokeless tobacco. He reports current alcohol use of about 2.0 standard drinks of alcohol per week. He reports that he does not currently use drugs.  Allergies: No Known Allergies  Medications: I have reviewed the patient's current medications.  The PMH, PSH, Medications, Allergies, and SH were reviewed and updated.  ROS: Constitutional: Negative for fever, weight loss and weight gain. Cardiovascular: Negative for chest pain and dyspnea on exertion. Respiratory: Is not experiencing shortness of breath at rest. Gastrointestinal: Negative for nausea and vomiting. Neurological: Negative for headaches. Psychiatric: The patient is not nervous/anxious  Blood pressure (!) 167/79, height 5' 8.5" (1.74 m), weight 228 lb (103.4 kg), SpO2 98%. Body mass index is 34.16 kg/m.  PHYSICAL EXAM:  Exam: General: Well-developed, well-nourished Respiratory Respiratory effort: Equal inspiration and expiration without stridor Cardiovascular Peripheral Vascular: Warm extremities with equal color/perfusion Eyes: No nystagmus with equal extraocular motion bilaterally Neuro/Psych/Balance: Patient oriented to person, place, and time; Appropriate mood and affect; Gait is intact with no imbalance; Cranial nerves I-XII are intact Head and Face Inspection: Normocephalic and atraumatic without mass or lesion Palpation: Facial skeleton intact without bony  stepoffs Salivary Glands: No mass or tenderness Facial Strength: Facial motility symmetric and full bilaterally ENT Pinna: External ear intact and fully developed External canal: Canal is patent with intact skin Tympanic Membrane: Clear and mobile External Nose: No scar or anatomic deformity Internal Nose: Septum is  deviated to the left. No polyp, or purulence. Mucosal edema and erythema present. No epistaxis but small area of ulceration with dry blood along right anterior septum. Bilateral inferior turbinate hypertrophy.  Lips, Teeth, and gums: Mucosa and teeth intact and viable TMJ: No pain to palpation with full mobility Oral cavity/oropharynx: No erythema or exudate, no lesions present Friedman IV tongue position no tonsils Nasopharynx: No mass or lesion with intact mucosa Hypopharynx: Intact mucosa without pooling of secretions Larynx Glottic: Full true vocal cord mobility without lesion or mass Supraglottic: Normal appearing epiglottis and AE folds Interarytenoid Space: Moderate pachydermia&edema Subglottic Space: Patent without lesion or edema Neck Neck and Trachea: Midline trachea without mass or lesion Thyroid: No mass or nodularity Lymphatics: No lymphadenopathy  Procedure: Preoperative diagnosis: OSA CPAP intolerance   Postoperative diagnosis:   Same  Procedure: Flexible fiberoptic laryngoscopy  Surgeon: Ashok Croon, MD  Anesthesia: Topical lidocaine and Afrin Complications: None Condition is stable throughout exam  Indications and consent:  The patient presents to the clinic with above symptoms. Indirect laryngoscopy view was incomplete. Thus it was recommended that they undergo a flexible fiberoptic laryngoscopy. All of the risks, benefits, and potential complications were reviewed with the patient preoperatively and verbal informed consent was obtained.  Procedure: The patient was seated upright in the clinic. Topical lidocaine and Afrin were applied to the nasal cavity. After adequate anesthesia had occurred, I then proceeded to pass the flexible telescope into the nasal cavity. The nasal cavity was patent without rhinorrhea or polyp. The nasopharynx was also patent without mass or lesion. The base of tongue was visualized and was normal. There were no signs of pooling of  secretions in the piriform sinuses. The true vocal folds were mobile bilaterally. There were no signs of glottic or supraglottic mucosal lesion or mass. There was moderate interarytenoid pachydermia and post cricoid edema. The telescope was then slowly withdrawn and the patient tolerated the procedure throughout.    PROCEDURE NOTE: nasal endoscopy  Preoperative diagnosis: chronic nasal congestion symptoms  Postoperative diagnosis: same  Procedure: Diagnostic nasal endoscopy (40981)  Surgeon: Ashok Croon, M.D.  Anesthesia: Topical lidocaine and Afrin  H&P REVIEW: The patient's history and physical were reviewed today prior to procedure. All medications were reviewed and updated as well. Complications: None Condition is stable throughout exam Indications and consent: The patient presents with symptoms of chronic sinusitis not responding to previous therapies. All the risks, benefits, and potential complications were reviewed with the patient preoperatively and informed consent was obtained. The time out was completed with confirmation of the correct procedure.   Procedure: The patient was seated upright in the clinic. Topical lidocaine and Afrin were applied to the nasal cavity. After adequate anesthesia had occurred, the rigid nasal endoscope was passed into the nasal cavity. The nasal mucosa, turbinates, septum, and sinus drainage pathways were visualized bilaterally. This revealed no purulence or significant secretions that might be cultured. There were no polyps or sites of significant inflammation. No epistaxis but small area of ulceration with dry blood along right anterior septum. The mucosa was intact and there was no crusting present. The scope was then slowly withdrawn and the patient tolerated the procedure well. There were no complications or blood  loss.    Assessment/Plan: Encounter Diagnoses  Name Primary?   OSA (obstructive sleep apnea) Yes   Intolerance of continuous  positive airway pressure (CPAP) ventilation    Chronic nasal congestion    Environmental and seasonal allergies    Epistaxis    Nasal septal deviation    Hypertrophy of both inferior nasal turbinates     Assessment and Plan    Obstructive Sleep Apnea (OSA)   Severe OSA diagnosed via home sleep test 2 yrs ago. CPAP therapy is uncomfortable due to mask fit issues and dislodgement during sleep, poor quality of sleep. He is interested in the Goltry Implant. Candidacy criteria include a recent sleep study, AHI >15, <25% central apneas, and no complete concentric collapse at the soft palate during DISE.. Oral appliance  noist recommended for severe OSA due to limited efficacy.    OSA, severe, without multilevel collapse, with failure to tolerate PAP therapy and/or more conservative measures. Presence of smaller/absent tonsils and larger tongue position (Friedman tongue position or modified Mallampati) suggests that hypopharyngeal/retrolingual collapse is contributing to the patient's OSA. Zachery Conch, M et al. Staging of obstructive sleep apnea/hypopnea syndrome: a guide to appropriate treatment. Laryngoscope, 2004 Mar, 114(3):454-9. PMID: [id number]) Options including positional therapy, weight loss, oral appliances, PAP and surgical correction discussed. Pt is not an ideal candidate for oral appliance due to severity of OSA Pt could be a candidate for Hypoglossal nerve stimulation (Inspire therapy) pending DISE and sleep study review    - Refer to [facility] for sleep study and follow-up care.   - Request prior sleep study results from primary care physician.   - Schedule drug-induced sleep endoscopy (DISE) to assess for complete concentric collapse.   - Provide Inspire Implant brochure and link to connect with a patient who has undergone the procedure.   - resume CPAP use for now  Chronic Nasal Congestion   Persistent nasal congestion following recent influenza. Occasional use of allergy  medication for symptom management in the past. Nasal endoscopy with septal deviation to the left and ITH, no polyps or purulence, no signs of bacterial sinusitis. No epistaxis or areas that require nasal cautery on exam today.  - Consider restarting Claritin 10 mg daily and nasal saline rinses  - consider trial of Flonase (with caution) due to recent episode of epistaxis   Epistaxis, self resolved   Recent left-sided epistaxis, likely due to nasal congestion and coughing from recent influenza. No persistent epistaxis or areas that require cautery on nasal endoscopy today - Advised on nasal care and use of saline spray Vaseline to prevent epistaxis      Thank you for allowing me to participate in the care of this patient. Please do not hesitate to contact me with any questions or concerns.   Ashok Croon, MD Otolaryngology [facility] Phone: [phone] Fax: [phone]    09/14/2023, 9:25 AM

## 2023-09-14 NOTE — Patient Instructions (Signed)
  It was very nice to meet you today,   Please see the following link for the [facility] program   [email or web address]  This website has information about how you can connect to other patients who have had Inspire Implant procedure

## 2023-09-15 ENCOUNTER — Encounter (INDEPENDENT_AMBULATORY_CARE_PROVIDER_SITE_OTHER): Payer: Self-pay

## 2023-09-18 ENCOUNTER — Encounter (HOSPITAL_BASED_OUTPATIENT_CLINIC_OR_DEPARTMENT_OTHER): Payer: Self-pay

## 2023-09-19 ENCOUNTER — Telehealth (INDEPENDENT_AMBULATORY_CARE_PROVIDER_SITE_OTHER): Payer: Self-pay

## 2023-09-19 NOTE — Telephone Encounter (Signed)
 I called his PCP office and requested a copy of his sleep study for [facility] approval

## 2023-09-26 ENCOUNTER — Ambulatory Visit (HOSPITAL_BASED_OUTPATIENT_CLINIC_OR_DEPARTMENT_OTHER)
Admission: RE | Admit: 2023-09-26 | Discharge: 2023-09-26 | Disposition: A | Discharge status: Home / Self Care | Attending: Otolaryngology | Admitting: Otolaryngology

## 2023-09-26 ENCOUNTER — Encounter (HOSPITAL_BASED_OUTPATIENT_CLINIC_OR_DEPARTMENT_OTHER)
Admission: RE | Disposition: A | Discharge status: Home / Self Care | Payer: Self-pay | Source: Home / Self Care | Attending: Otolaryngology

## 2023-09-26 ENCOUNTER — Other Ambulatory Visit (INDEPENDENT_AMBULATORY_CARE_PROVIDER_SITE_OTHER): Payer: Self-pay | Admitting: Otolaryngology

## 2023-09-26 ENCOUNTER — Other Ambulatory Visit: Payer: Self-pay

## 2023-09-26 ENCOUNTER — Encounter (HOSPITAL_BASED_OUTPATIENT_CLINIC_OR_DEPARTMENT_OTHER): Payer: Self-pay

## 2023-09-26 ENCOUNTER — Ambulatory Visit (HOSPITAL_BASED_OUTPATIENT_CLINIC_OR_DEPARTMENT_OTHER): Admitting: Certified Registered Nurse Anesthetist

## 2023-09-26 DIAGNOSIS — Z01818 Encounter for other preprocedural examination: Secondary | ICD-10-CM

## 2023-09-26 DIAGNOSIS — Z789 Other specified health status: Secondary | ICD-10-CM

## 2023-09-26 DIAGNOSIS — M1990 Unspecified osteoarthritis, unspecified site: Secondary | ICD-10-CM | POA: Diagnosis not present

## 2023-09-26 DIAGNOSIS — G4733 Obstructive sleep apnea (adult) (pediatric): Secondary | ICD-10-CM | POA: Insufficient documentation

## 2023-09-26 DIAGNOSIS — J342 Deviated nasal septum: Secondary | ICD-10-CM | POA: Insufficient documentation

## 2023-09-26 DIAGNOSIS — J45909 Unspecified asthma, uncomplicated: Secondary | ICD-10-CM | POA: Insufficient documentation

## 2023-09-26 HISTORY — PX: DRUG INDUCED ENDOSCOPY: SHX6808

## 2023-09-26 HISTORY — DX: Sleep apnea, unspecified: G47.30

## 2023-09-26 SURGERY — DRUG INDUCED SLEEP ENDOSCOPY
Anesthesia: Monitor Anesthesia Care | Site: Nose

## 2023-09-26 MED ORDER — OXYMETAZOLINE HCL 0.05 % NA SOLN
NASAL | Status: AC
  Filled 2023-09-26: qty 150

## 2023-09-26 MED ORDER — PROPOFOL 500 MG/50ML IV EMUL
INTRAVENOUS | Status: AC
  Filled 2023-09-26: qty 50

## 2023-09-26 MED ORDER — PROPOFOL 500 MG/50ML IV EMUL
INTRAVENOUS | Status: DC | PRN
  Administered 2023-09-26: 125 ug/kg/min via INTRAVENOUS

## 2023-09-26 MED ORDER — PROPOFOL 10 MG/ML IV BOLUS
INTRAVENOUS | Status: AC
  Filled 2023-09-26: qty 20

## 2023-09-26 MED ORDER — OXYMETAZOLINE HCL 0.05 % NA SOLN
NASAL | Status: DC | PRN
  Administered 2023-09-26: 1 via TOPICAL

## 2023-09-26 MED ORDER — ACETAMINOPHEN 10 MG/ML IV SOLN: 1000.0000 mg | Freq: Once | INTRAVENOUS | Status: DC | PRN

## 2023-09-26 MED ORDER — ACETAMINOPHEN 500 MG PO TABS: 1000.0000 mg | ORAL_TABLET | Freq: Once | ORAL | Status: DC | PRN

## 2023-09-26 MED ORDER — LIDOCAINE-EPINEPHRINE 1 %-1:100000 IJ SOLN
INTRAMUSCULAR | Status: AC
  Filled 2023-09-26: qty 3

## 2023-09-26 MED ORDER — ACETAMINOPHEN 160 MG/5ML PO SOLN: 1000.0000 mg | Freq: Once | ORAL | Status: DC | PRN

## 2023-09-26 MED ORDER — LIDOCAINE-EPINEPHRINE 1 %-1:100000 IJ SOLN
INTRAMUSCULAR | Status: DC | PRN
  Administered 2023-09-26: 7 mL

## 2023-09-26 MED ORDER — BACITRACIN ZINC 500 UNIT/GM EX OINT
TOPICAL_OINTMENT | CUTANEOUS | Status: AC
  Filled 2023-09-26: qty 28.35

## 2023-09-26 MED ORDER — CIPROFLOXACIN-DEXAMETHASONE 0.3-0.1 % OT SUSP
OTIC | Status: AC
  Filled 2023-09-26: qty 22.5

## 2023-09-26 MED ORDER — PROPOFOL 10 MG/ML IV BOLUS
INTRAVENOUS | Status: DC | PRN
  Administered 2023-09-26: 10 mg via INTRAVENOUS

## 2023-09-26 MED ORDER — LACTATED RINGERS IV SOLN
INTRAVENOUS | Status: DC
  Administered 2023-09-26: 1000 mL via INTRAVENOUS

## 2023-09-26 MED ORDER — LIDOCAINE 2% (20 MG/ML) 5 ML SYRINGE
INTRAMUSCULAR | Status: DC | PRN
Start: 2023-09-26 — End: 2023-09-26
  Administered 2023-09-26: 100 mg via INTRAVENOUS

## 2023-09-26 MED ORDER — MUPIROCIN 2 % EX OINT
TOPICAL_OINTMENT | CUTANEOUS | Status: AC
  Filled 2023-09-26: qty 22

## 2023-09-26 SURGICAL SUPPLY — 15 items
ANTIFOG SOL W/FOAM PAD STRL (MISCELLANEOUS) ×1 IMPLANT
BRONCHOSCOPE PED SLIM DISP (MISCELLANEOUS) ×1 IMPLANT
CANISTER SUCT 1200ML W/VALVE (MISCELLANEOUS) IMPLANT
GLOVE BIO SURGEON STRL SZ 6 (GLOVE) IMPLANT
GLOVE BIOGEL PI IND STRL 7.0 (GLOVE) IMPLANT
KIT CLEAN ENDO (MISCELLANEOUS) IMPLANT
NDL HYPO 18GX1.5 BLUNT FILL (NEEDLE) ×1 IMPLANT
NEEDLE HYPO 18GX1.5 BLUNT FILL (NEEDLE) ×1 IMPLANT
PATTIES SURGICAL .5 X3 (DISPOSABLE) ×1 IMPLANT
SHEET MEDIUM DRAPE 40X70 STRL (DRAPES) IMPLANT
SOLUTION ANTFG W/FOAM PAD STRL (MISCELLANEOUS) ×1 IMPLANT
SURGILUBE 2OZ TUBE FLIPTOP (MISCELLANEOUS) ×1 IMPLANT
SYR 10ML LL (SYRINGE) ×1 IMPLANT
TOWEL GREEN STERILE FF (TOWEL DISPOSABLE) ×1 IMPLANT
TUBE CONNECTING 20X1/4 (TUBING) IMPLANT

## 2023-09-26 NOTE — Anesthesia Preprocedure Evaluation (Signed)
 Anesthesia Evaluation  Patient identified by MRN, date of birth, ID band Patient awake    Reviewed: Allergy & Precautions, H&P , NPO status , Patient's Chart, lab work & pertinent test results  Airway Mallampati: IV  TM Distance: >3 FB Neck ROM: Full    Dental  (+) Dental Advisory Given, Teeth Intact   Pulmonary asthma , sleep apnea and Continuous Positive Airway Pressure Ventilation , Recent URI , Resolved   breath sounds clear to auscultation       Cardiovascular negative cardio ROS  Rhythm:Regular     Neuro/Psych negative neurological ROS  negative psych ROS   GI/Hepatic negative GI ROS, Neg liver ROS,,,  Endo/Other  negative endocrine ROS    Renal/GU negative Renal ROS     Musculoskeletal  (+) Arthritis ,    Abdominal   Peds  Hematology negative hematology ROS (+)   Anesthesia Other Findings   Reproductive/Obstetrics                             Anesthesia Physical Anesthesia Plan  ASA: 2  Anesthesia Plan: MAC   Post-op Pain Management:    Induction: Intravenous  PONV Risk Score and Plan: 1 and Treatment may vary due to age or medical condition  Airway Management Planned: Nasal Cannula, Natural Airway and Simple Face Mask  Additional Equipment: None  Intra-op Plan:   Post-operative Plan:   Informed Consent: I have reviewed the patients History and Physical, chart, labs and discussed the procedure including the risks, benefits and alternatives for the proposed anesthesia with the patient or authorized representative who has indicated his/her understanding and acceptance.     Dental advisory given  Plan Discussed with: CRNA  Anesthesia Plan Comments:        Anesthesia Quick Evaluation

## 2023-09-26 NOTE — Transfer of Care (Signed)
 Immediate Anesthesia Transfer of Care Note  Patient: Jesse Johns  Procedure(s) Performed: DRUG INDUCED SLEEP ENDOSCOPY (Nose)  Patient Location: PACU  Anesthesia Type:MAC  Level of Consciousness: awake, alert , and oriented  Airway & Oxygen Therapy: Patient Spontanous Breathing  Post-op Assessment: Report given to RN and Post -op Vital signs reviewed and stable  Post vital signs: Reviewed and stable  Last Vitals:  Vitals Value Taken Time  BP 141/124 09/26/23 0752  Temp 36.9 C 09/26/23 0753  Pulse 69 09/26/23 0754  Resp 18 09/26/23 0754  SpO2 95 % 09/26/23 0754  Vitals shown include unfiled device data.  Last Pain:  Vitals:   09/26/23 0754  TempSrc:   PainSc: 0-No pain      Patients Stated Pain Goal: 6 (09/26/23 0650)  Complications: No notable events documented.

## 2023-09-26 NOTE — Op Note (Signed)
 Operative note  Preoperative diagnosis: OSA Postoperative diagnosis:   Same  Procedure:DISE ( Drug induced sleep endoscopy)  Anesthesia: Topical lidocaine gel Complications: None Condition is stable throughout exam  Indications and consent:   The patient presents to my otolaryngology clinic with a chief complaint of OSA  Because of pt's OSA and desire to be a candidate for Inspire therapy, it was recommended that they undergo a flexible fiberoptic laryngoscopy under sedation (DISE).   All the risks, benefits, and potential complications were reviewed with the patient preoperatively and informed consent was obtained.  Procedure: Pt was brought back to the OR and laid supine on the table. Propofol anesthesia was administered per protocol until pt reached optimal level of sedation. DISE exam showed the following anatomic collapse pattern.  VOTE classification Velopharynx- 2 A-P Oropharynx- 1 Tongue base- 2 Epiglottis- 2  No evidence of complete concentric collapse at the soft palate.   Based on the DISE findings, pt is a candidate for Hypoglossal nerve stimulation (Inspire therapy) based on the above anatomy.

## 2023-09-26 NOTE — Discharge Instructions (Addendum)
 DRUG-INDUCED SLEEP ENDOSCOPY POST-OPERATIVE INSTRUCTIONS:  Based on the drug-induced sleep endoscopy today, you were deemed a candidate for Inspire Therapy.  Please review post-operative and recovery instructions below that you will need to be aware of after Inspire Implant surgery.  Please restart all of your home medications if you take anything on a daily basis.  You can resume regular diet after this procedure.  You will be scheduled for pre-operative appointment with Dr. Irene Pap to review details about surgery and to discuss the next steps.   DIET: Resume normal diet HYGIENE: Please wait until 48 hours after surgery before getting incisions on neck, chest, and torso wet. In the first 48 hours after surgery, will likely need to take sponge baths. WOUND CARE: Please leave pressure dressing on for 48 hours after surgery. Gently place antibiotic ointment over incisions 2 times per Espy; use clean q-tip. May place a clean bandage over incisions as needed. After 48 hours, you may get incisions wet with warm soap and water, but do not soak the incisions.  Pat area dry gently.  Immediately place antibiotic ointment. Take oral antibiotics as prescribed If skin around incision starts to get red (> 1cm), swollen, and/or more painful, please call the office ACTIVITY: Try to avoid sleeping on the side of your surgery, to the extent possible.   You may walk for exercise starting the Tsang after surgery. For 2 weeks: Do not pick up anything greater than 5 pounds with the hand/arm that's on the same side as the surgery.  After 2 weeks, you may increase weight to 10 pounds.   Consider performing neck rolls 10 clockwise and 10 counterclockwise 3x/Korb. For 4 weeks, no strenuous activity (running, jogging, lifting weights, gardening, sports) or until cleared by physician.   PAIN MEDICATIONS: You will be prescribed a medication for pain.   If pain is not severe, consider taking Tylenol 650mg  every 6  hours Avoid aspirin for 7 days after surgery Avoid direct heat (such as heating pads) to incision sites.   May gently place ice over surgery sites as needed.  Please place a thin clean towel over skin first and then place ice bag over towel.  Ice for 10 minutes at a time only.  POST-OPERATIVE CLINIC APPOINTMENTS: 1 week: suture removal and wound check in the office.  1 month: device activation and wound check in the office. 2.5 months: check in visit to assess usage. 3-4 months: titration sleep study based on usage of >4 hrs/night.  4 months: final wound check in the office.  Yearly: device check at office.  SCAR CARE: After incisions have healed, you will have a scar, which will continue to evolve over the course of 12 months.  Caring for your incision scars will help them to be as minimal as possible. If you are out in the sun with incision exposure, please remember to place sunscreen over the incision and surrounding skin.   You may use vitamin E or "Scar ointment/cream" to help soften scar.  Please wait one month after surgery before starting this.     Post Anesthesia Home Care Instructions  Activity: Get plenty of rest for the remainder of the Klos. A responsible individual must stay with you for 24 hours following the procedure.  For the next 24 hours, DO NOT: -Drive a car -Advertising copywriter -Drink alcoholic beverages -Take any medication unless instructed by your physician -Make any legal decisions or sign important papers.  Meals: Start with liquid foods such as gelatin or  soup. Progress to regular foods as tolerated. Avoid greasy, spicy, heavy foods. If nausea and/or vomiting occur, drink only clear liquids until the nausea and/or vomiting subsides. Call your physician if vomiting continues.  Special Instructions/Symptoms: Your throat may feel dry or sore from the anesthesia or the breathing tube placed in your throat during surgery. If this causes discomfort, gargle with  warm salt water. The discomfort should disappear within 24 hours.

## 2023-09-26 NOTE — Interval H&P Note (Signed)
 History and Physical Interval Note:  09/26/2023 7:22 AM  Jesse Johns  has presented today for surgery, with the diagnosis of OBSTRUCTIVE SLEEP APNEA.  The various methods of treatment have been discussed with the patient and family. After consideration of risks, benefits and other options for treatment, the patient has consented to  Procedure(s): DRUG INDUCED SLEEP ENDOSCOPY (N/A) as a surgical intervention.  The patient's history has been reviewed, patient examined, no change in status, stable for surgery.  I have reviewed the patient's chart and labs.  Questions were answered to the patient's satisfaction.     Ashok Croon

## 2023-09-27 ENCOUNTER — Encounter (HOSPITAL_BASED_OUTPATIENT_CLINIC_OR_DEPARTMENT_OTHER): Payer: Self-pay | Admitting: Otolaryngology

## 2023-09-28 DIAGNOSIS — J45909 Unspecified asthma, uncomplicated: Secondary | ICD-10-CM | POA: Diagnosis not present

## 2023-09-28 DIAGNOSIS — J343 Hypertrophy of nasal turbinates: Secondary | ICD-10-CM | POA: Diagnosis not present

## 2023-09-28 DIAGNOSIS — G4733 Obstructive sleep apnea (adult) (pediatric): Secondary | ICD-10-CM | POA: Diagnosis not present

## 2023-09-28 DIAGNOSIS — J342 Deviated nasal septum: Secondary | ICD-10-CM | POA: Diagnosis not present

## 2023-09-28 DIAGNOSIS — Z789 Other specified health status: Secondary | ICD-10-CM | POA: Diagnosis not present

## 2023-09-28 NOTE — Anesthesia Postprocedure Evaluation (Signed)
 Anesthesia Post Note  Patient: Jesse Johns  Procedure(s) Performed: DRUG INDUCED SLEEP ENDOSCOPY (Nose)     Patient location during evaluation: PACU Anesthesia Type: MAC Level of consciousness: awake and alert Pain management: pain level controlled Vital Signs Assessment: post-procedure vital signs reviewed and stable Respiratory status: spontaneous breathing, nonlabored ventilation and respiratory function stable Cardiovascular status: blood pressure returned to baseline and stable Postop Assessment: no apparent nausea or vomiting Anesthetic complications: no   No notable events documented.               Slevin Gunby

## 2023-10-03 DIAGNOSIS — L578 Other skin changes due to chronic exposure to nonionizing radiation: Secondary | ICD-10-CM | POA: Diagnosis not present

## 2023-10-03 DIAGNOSIS — L821 Other seborrheic keratosis: Secondary | ICD-10-CM | POA: Diagnosis not present

## 2023-10-03 DIAGNOSIS — L570 Actinic keratosis: Secondary | ICD-10-CM | POA: Diagnosis not present

## 2023-10-17 DIAGNOSIS — E669 Obesity, unspecified: Secondary | ICD-10-CM | POA: Diagnosis not present

## 2023-10-17 DIAGNOSIS — G4733 Obstructive sleep apnea (adult) (pediatric): Secondary | ICD-10-CM | POA: Diagnosis not present

## 2023-10-17 DIAGNOSIS — Z6833 Body mass index (BMI) 33.0-33.9, adult: Secondary | ICD-10-CM | POA: Diagnosis not present

## 2023-10-17 DIAGNOSIS — Z008 Encounter for other general examination: Secondary | ICD-10-CM | POA: Diagnosis not present

## 2023-11-14 ENCOUNTER — Telehealth (INDEPENDENT_AMBULATORY_CARE_PROVIDER_SITE_OTHER): Payer: Self-pay | Admitting: Otolaryngology

## 2023-11-14 NOTE — Telephone Encounter (Signed)
 LVM for patient to call back to schedule follow-up appointment with Dr. Soldatova since patient has had his sleep study done.

## 2023-11-20 ENCOUNTER — Telehealth (INDEPENDENT_AMBULATORY_CARE_PROVIDER_SITE_OTHER): Payer: Self-pay | Admitting: Otolaryngology

## 2023-11-20 NOTE — Telephone Encounter (Signed)
 Patient LVM requesting an appt w/ Dr. Soldatova.  I called and LVM to call our office.  Patient needs to have completed sleep study and Dr. Soldatova will have to have those results before follow up appt.

## 2023-11-29 ENCOUNTER — Telehealth (INDEPENDENT_AMBULATORY_CARE_PROVIDER_SITE_OTHER): Payer: Self-pay | Admitting: Otolaryngology

## 2023-11-29 NOTE — Telephone Encounter (Signed)
 Confirmed appt & location [id number]

## 2023-11-30 ENCOUNTER — Ambulatory Visit (INDEPENDENT_AMBULATORY_CARE_PROVIDER_SITE_OTHER): Admitting: Otolaryngology

## 2023-11-30 ENCOUNTER — Encounter (INDEPENDENT_AMBULATORY_CARE_PROVIDER_SITE_OTHER): Payer: Self-pay | Admitting: Otolaryngology

## 2023-11-30 VITALS — BP 113/74 | HR 74

## 2023-11-30 DIAGNOSIS — G4730 Sleep apnea, unspecified: Secondary | ICD-10-CM

## 2023-11-30 DIAGNOSIS — J342 Deviated nasal septum: Secondary | ICD-10-CM

## 2023-11-30 DIAGNOSIS — J343 Hypertrophy of nasal turbinates: Secondary | ICD-10-CM

## 2023-11-30 DIAGNOSIS — Z789 Other specified health status: Secondary | ICD-10-CM

## 2023-11-30 DIAGNOSIS — Z91198 Patient's noncompliance with other medical treatment and regimen for other reason: Secondary | ICD-10-CM | POA: Diagnosis not present

## 2023-11-30 DIAGNOSIS — G4733 Obstructive sleep apnea (adult) (pediatric): Secondary | ICD-10-CM

## 2023-11-30 DIAGNOSIS — J3089 Other allergic rhinitis: Secondary | ICD-10-CM

## 2023-11-30 DIAGNOSIS — R0981 Nasal congestion: Secondary | ICD-10-CM

## 2023-11-30 NOTE — Progress Notes (Signed)
 ENT Progress Note:   Update 11/30/2023  Discussed the use of AI scribe software for clinical note transcription with the patient, who gave verbal consent to proceed.  History of Present Illness Jesse Johns is a 73 year old male with moderate sleep apnea who presents for f/u after repeat sleep study with [facility]   He has been diagnosed with moderate sleep apnea, confirmed by a sleep study. He is considering scheduling Inspire  to address this condition and is in contact with the surgery schedulers. He is contemplating a date in the late fall to avoid interrupting his golf activities, which he describes as his primary form of exercise.Aaron Aas  He mentions having a deviated septum and inquires about its impact on his sleep apnea. His son also has a deviated septum, suggesting a possible hereditary link.   Records Reviewed:  Initial Evaluation  Reason for Consult: OSA CPAP intolerance   Ref provider: Barbar Levine, MD PCP    HPI: Discussed the use of AI scribe software for clinical note transcription with the patient, who gave verbal consent to proceed.  History of Present Illness   The patient is a 30 yoM with hx of severe obstructive sleep apnea, presents with difficulty tolerating CPAP therapy and seeking alternatives. He was referred by his primary care doctor for evaluation.  He has a history of severe sleep apnea diagnosed via a home sleep test conducted a couple of years ago. He is currently using CPAP therapy but finds it uncomfortable and difficult to manage, particularly due to issues with mask fit and movement during sleep. The CPAP machine has been pulled off the nightstand during sleep several times, causing disturbances. He reports poor quality of sleep and trouble with CPAP mask in place due to discomfort. Tried various masks. He is interested in exploring alternatives to CPAP therapy.  He experiences chronic nasal congestion. Had a flu three weeks ago, which led to a  temporary discontinuation of CPAP use for two weeks because of epistaxis and worsening of his baseline nasal congestion. He occasionally uses allergy medication for his nasal congestion but not every [place].  He reports a recent minor weight loss during his flu illness but notes a general trend of weight gain over time despite not eating much, attributing it to a lack of exercise. No history of heart disease, arrhythmia, or strokes/MI. He is not on any blood thinners. BMI today 34. Denies hx of insomnia.        Past Medical History:  Diagnosis Date   Allergy    hayfever, cats   Arthritis    toe   Asthma 10/30/2013   Sleep apnea     Past Surgical History:  Procedure Laterality Date   COLONOSCOPY  03/25/2013   DRUG INDUCED ENDOSCOPY N/A 09/26/2023   Procedure: DRUG INDUCED SLEEP ENDOSCOPY;  Surgeon: Artice Last, MD;  Location: [facility];  Service: ENT;  Laterality: N/A;   ELBOW SURGERY Right    KNEE ARTHROSCOPY W/ MENISCAL REPAIR  06/13/2018   POLYPECTOMY     TONSILLECTOMY AND ADENOIDECTOMY      Family History  Problem Relation Age of Onset   Colon cancer Neg Hx    Esophageal cancer Neg Hx    Rectal cancer Neg Hx    Stomach cancer Neg Hx    Colon polyps Neg Hx     Social History:  reports that he has never smoked. He has never used smokeless tobacco. He reports current alcohol  use of about  2.0 standard drinks of alcohol  per week. He reports that he does not currently use drugs.  Allergies: No Known Allergies  Medications: I have reviewed the patient's current medications.  The PMH, PSH, Medications, Allergies, and SH were reviewed and updated.  ROS: Constitutional: Negative for fever, weight loss and weight gain. Cardiovascular: Negative for chest pain and dyspnea on exertion. Respiratory: Is not experiencing shortness of breath at rest. Gastrointestinal: Negative for nausea and vomiting. Neurological: Negative for headaches. Psychiatric: The patient  is not nervous/anxious  Blood pressure 113/74, pulse 74, SpO2 96%. There is no height or weight on file to calculate BMI.  PHYSICAL EXAM:  Exam: General: Well-developed, well-nourished Respiratory Respiratory effort: Equal inspiration and expiration without stridor Cardiovascular Peripheral Vascular: Warm extremities with equal color/perfusion Eyes: No nystagmus with equal extraocular motion bilaterally Neuro/Psych/Balance: Patient oriented to person, place, and time; Appropriate mood and affect; Gait is intact with no imbalance; Cranial nerves I-XII are intact Head and Face Inspection: Normocephalic and atraumatic without mass or lesion Palpation: Facial skeleton intact without bony stepoffs Salivary Glands: No mass or tenderness Facial Strength: Facial motility symmetric and full bilaterally ENT Pinna: External ear intact and fully developed External canal: Canal is patent with intact skin External Nose: No scar or anatomic deformity  Lips, Teeth, and gums: Mucosa and teeth intact and viable Neck Neck and Trachea: Midline trachea without mass or lesion Thyroid: No mass or nodularity Lymphatics: No lymphadenopathy  Studies reviewed  Sleep study results with [facility] done on 11/08/23 BMI 34 AHI 17.2 6.6% of central/mixed apneas Already approved for Inspire Implant     Assessment/Plan: Encounter Diagnoses  Name Primary?   OSA (obstructive sleep apnea) Yes   Intolerance of continuous positive airway pressure (CPAP) ventilation    Chronic nasal congestion    Environmental and seasonal allergies    Nasal septal deviation    Hypertrophy of both inferior nasal turbinates      Assessment and Plan    Obstructive Sleep Apnea (OSA)   Severe OSA diagnosed via home sleep test 2 yrs ago. CPAP therapy is uncomfortable due to mask fit issues and dislodgement during sleep, poor quality of sleep. He is interested in the [place] Implant. Candidacy criteria include a recent sleep  study, AHI >15, <25% central apneas, and no complete concentric collapse at the soft palate during DISE.. Oral appliance  noist recommended for severe OSA due to limited efficacy.    OSA, severe, without multilevel collapse, with failure to tolerate PAP therapy and/or more conservative measures. Presence of smaller/absent tonsils and larger tongue position (Friedman tongue position or modified Mallampati) suggests that hypopharyngeal/retrolingual collapse is contributing to the patient's OSA. Reather Campbell, M et al. Staging of obstructive sleep apnea/hypopnea syndrome: a guide to appropriate treatment. Laryngoscope, 2004 Mar, 114(3):454-9. PMID: [id number]) Options including positional therapy, weight loss, oral appliances, PAP and surgical correction discussed. Pt is not an ideal candidate for oral appliance due to severity of OSA Pt could be a candidate for Hypoglossal nerve stimulation (Insp[facility] therapy) pending DISE and sleep study review    - Refer to [facility] for sleep study and follow-up care.   - Request prior sleep study results from primary care physician.   - Schedule drug-induced sleep endoscopy (DISE) to assess for complete concentric collapse.   - Provide Inspire Implant brochure and link to connect with a patient who has undergone the procedure.   - resume CPAP use for now  Chronic Nasal Congestion   Nasal endoscopy findings: Septum is  deviated to the left. No polyp, or purulence. Mucosal edema and erythema present. No epistaxis but small area of ulceration with dry blood along right anterior septum. Bilateral inferior turbinate hypertrophy.  Persistent nasal congestion following recent influenza. Occasional use of allergy medication for symptom management in the past. Nasal endoscopy with septal deviation to the left and ITH, no polyps or purulence, no signs of bacterial sinusitis. No epistaxis or areas that require nasal cautery on exam today.  - Consider restarting Claritin 10 mg  daily and nasal saline rinses  - consider trial of Flonase (with caution) due to recent episode of epistaxis   Epistaxis, self resolved   Recent left-sided epistaxis, likely due to nasal congestion and coughing from recent influenza. No persistent epistaxis or areas that require cautery on nasal endoscopy today - Advised on nasal care and use of saline spray Vaseline to prevent epistaxis      Update 11/30/2023 Sleep study results with [facility] done on 11/08/23 BMI 34 AHI 17.2 6.6% of central/mixed apneas Already approved for [facility] Implant   Assessment & Plan Moderate sleep apnea CPAP intolerance  Moderate sleep apnea confirmed by sleep study. Approved for Inspire implant. Procedure duration 1.5-2 hours with same-Lazcano discharge. Post-op pain managed with narcotics, swelling expected. Avoid strenuous activities for a month. Risks include fluid/blood collection, sore throat, rare nerve praxia, PTX Infection risk mitigated with antibiotics. Deviated septum not significantly affecting sleep apnea but may impact CPAP tolerance. He would like to proceed with surgery sometime in the fall.  - Schedule surgery for late fall within insurance approval window. - Advised avoiding strenuous activities, including golf, for about a month post-surgery. - Arrange follow-up call to discuss potential surgery dates in the fall. -RTC 3 mo before surgery    .   Artice Last, MD Otolaryngology [facility] Phone: [phone] Fax: [phone]    11/30/2023, 8:45 AM

## 2024-01-11 DIAGNOSIS — R7301 Impaired fasting glucose: Secondary | ICD-10-CM | POA: Diagnosis not present

## 2024-01-11 DIAGNOSIS — E782 Mixed hyperlipidemia: Secondary | ICD-10-CM | POA: Diagnosis not present

## 2024-01-23 DIAGNOSIS — Z6835 Body mass index (BMI) 35.0-35.9, adult: Secondary | ICD-10-CM | POA: Diagnosis not present

## 2024-01-23 DIAGNOSIS — Z1331 Encounter for screening for depression: Secondary | ICD-10-CM | POA: Diagnosis not present

## 2024-01-23 DIAGNOSIS — G4733 Obstructive sleep apnea (adult) (pediatric): Secondary | ICD-10-CM | POA: Diagnosis not present

## 2024-01-23 DIAGNOSIS — Z1389 Encounter for screening for other disorder: Secondary | ICD-10-CM | POA: Diagnosis not present

## 2024-01-23 DIAGNOSIS — Z139 Encounter for screening, unspecified: Secondary | ICD-10-CM | POA: Diagnosis not present

## 2024-01-23 DIAGNOSIS — Z0000 Encounter for general adult medical examination without abnormal findings: Secondary | ICD-10-CM | POA: Diagnosis not present

## 2024-01-23 DIAGNOSIS — Z1339 Encounter for screening examination for other mental health and behavioral disorders: Secondary | ICD-10-CM | POA: Diagnosis not present

## 2024-01-23 DIAGNOSIS — R7303 Prediabetes: Secondary | ICD-10-CM | POA: Diagnosis not present

## 2024-01-23 DIAGNOSIS — E782 Mixed hyperlipidemia: Secondary | ICD-10-CM | POA: Diagnosis not present

## 2024-01-23 DIAGNOSIS — Z136 Encounter for screening for cardiovascular disorders: Secondary | ICD-10-CM | POA: Diagnosis not present

## 2024-04-09 DIAGNOSIS — L820 Inflamed seborrheic keratosis: Secondary | ICD-10-CM | POA: Diagnosis not present

## 2024-04-09 DIAGNOSIS — L821 Other seborrheic keratosis: Secondary | ICD-10-CM | POA: Diagnosis not present

## 2024-04-09 DIAGNOSIS — L578 Other skin changes due to chronic exposure to nonionizing radiation: Secondary | ICD-10-CM | POA: Diagnosis not present

## 2024-04-09 DIAGNOSIS — L570 Actinic keratosis: Secondary | ICD-10-CM | POA: Diagnosis not present

## 2024-06-03 ENCOUNTER — Encounter (INDEPENDENT_AMBULATORY_CARE_PROVIDER_SITE_OTHER): Admitting: Otolaryngology

## 2024-06-11 ENCOUNTER — Encounter (HOSPITAL_BASED_OUTPATIENT_CLINIC_OR_DEPARTMENT_OTHER): Payer: Self-pay

## 2024-06-11 ENCOUNTER — Ambulatory Visit (HOSPITAL_BASED_OUTPATIENT_CLINIC_OR_DEPARTMENT_OTHER): Admit: 2024-06-11

## 2024-06-11 SURGERY — INSERTION, HYPOGLOSSAL NERVE STIMULATOR
Anesthesia: General

## 2024-07-01 ENCOUNTER — Encounter (INDEPENDENT_AMBULATORY_CARE_PROVIDER_SITE_OTHER): Admitting: Otolaryngology
